# Patient Record
Sex: Female | Born: 1950 | Race: Black or African American | Hispanic: No | Smoking: Never smoker
Health system: Southern US, Community
[De-identification: ages and names within clinical notes are randomized; demographics above are authoritative.]

## PROBLEM LIST (undated history)

## (undated) DIAGNOSIS — M1990 Unspecified osteoarthritis, unspecified site: Secondary | ICD-10-CM

## (undated) DIAGNOSIS — I639 Cerebral infarction, unspecified: Secondary | ICD-10-CM

## (undated) DIAGNOSIS — E119 Type 2 diabetes mellitus without complications: Secondary | ICD-10-CM

---

## 2017-07-01 ENCOUNTER — Other Ambulatory Visit: Payer: Self-pay | Admitting: Gerontology

## 2017-07-01 DIAGNOSIS — R079 Chest pain, unspecified: Secondary | ICD-10-CM

## 2017-07-01 DIAGNOSIS — Z8249 Family history of ischemic heart disease and other diseases of the circulatory system: Secondary | ICD-10-CM

## 2017-07-02 ENCOUNTER — Telehealth (HOSPITAL_COMMUNITY): Payer: Self-pay | Admitting: Gerontology

## 2017-07-03 ENCOUNTER — Other Ambulatory Visit: Payer: Self-pay | Admitting: Nurse Practitioner

## 2017-07-03 ENCOUNTER — Ambulatory Visit
Admission: RE | Admit: 2017-07-03 | Discharge: 2017-07-03 | Disposition: A | Discharge status: Home / Self Care | Payer: Medicare (Managed Care) | Source: Ambulatory Visit | Attending: Nurse Practitioner | Admitting: Nurse Practitioner

## 2017-07-03 DIAGNOSIS — R079 Chest pain, unspecified: Secondary | ICD-10-CM

## 2017-07-03 DIAGNOSIS — R059 Cough, unspecified: Secondary | ICD-10-CM

## 2017-07-03 DIAGNOSIS — R05 Cough: Secondary | ICD-10-CM

## 2017-07-03 NOTE — Telephone Encounter (Signed)
User: Cherie Dark A Date/time: 07/02/17 9:28 AM  Comment: Called the office and lmsg for Kennyth Lose to [employer] for the last office note and possible EKG.  Context:  Outcome: Left Message  Phone number: [phone] Phone Type:   Comm. type: Telephone Call type: Outgoing  Contact: Darrelyn Hillock Relation to patient: Provider

## 2017-07-07 ENCOUNTER — Ambulatory Visit (HOSPITAL_COMMUNITY): Payer: Self-pay

## 2017-07-08 ENCOUNTER — Ambulatory Visit (HOSPITAL_COMMUNITY): Payer: Self-pay

## 2017-07-13 ENCOUNTER — Ambulatory Visit (HOSPITAL_COMMUNITY): Payer: Self-pay

## 2017-07-20 ENCOUNTER — Ambulatory Visit (HOSPITAL_COMMUNITY): Payer: Self-pay

## 2017-07-21 ENCOUNTER — Ambulatory Visit (HOSPITAL_COMMUNITY): Payer: Self-pay

## 2017-07-22 ENCOUNTER — Telehealth (HOSPITAL_COMMUNITY): Payer: Self-pay | Admitting: *Deleted

## 2017-07-22 NOTE — Telephone Encounter (Signed)
Attempted to call patient regarding upcoming appointment, 07/27/17, no answer.  [name] [name]

## 2017-07-23 ENCOUNTER — Other Ambulatory Visit (HOSPITAL_COMMUNITY): Payer: Self-pay | Admitting: Gerontology

## 2017-07-23 DIAGNOSIS — Z8249 Family history of ischemic heart disease and other diseases of the circulatory system: Secondary | ICD-10-CM

## 2017-07-23 DIAGNOSIS — R079 Chest pain, unspecified: Secondary | ICD-10-CM

## 2017-07-27 ENCOUNTER — Ambulatory Visit (HOSPITAL_COMMUNITY): Payer: Self-pay

## 2017-07-28 ENCOUNTER — Other Ambulatory Visit: Payer: Self-pay | Admitting: Family Medicine

## 2017-07-28 ENCOUNTER — Ambulatory Visit (HOSPITAL_COMMUNITY): Payer: Medicare (Managed Care)

## 2017-07-28 ENCOUNTER — Ambulatory Visit (HOSPITAL_COMMUNITY): Admission: RE | Admit: 2017-07-28 | Payer: Medicare (Managed Care) | Source: Ambulatory Visit

## 2017-07-28 DIAGNOSIS — Z1231 Encounter for screening mammogram for malignant neoplasm of breast: Secondary | ICD-10-CM

## 2017-07-30 ENCOUNTER — Encounter (HOSPITAL_COMMUNITY): Payer: Self-pay | Admitting: Radiology

## 2017-07-30 ENCOUNTER — Encounter (HOSPITAL_COMMUNITY)
Admission: RE | Admit: 2017-07-30 | Discharge: 2017-07-30 | Disposition: A | Discharge status: Home / Self Care | Payer: Medicare (Managed Care) | Source: Ambulatory Visit | Attending: Gerontology | Admitting: Gerontology

## 2017-07-30 DIAGNOSIS — R079 Chest pain, unspecified: Secondary | ICD-10-CM

## 2017-07-30 DIAGNOSIS — Z8249 Family history of ischemic heart disease and other diseases of the circulatory system: Secondary | ICD-10-CM

## 2017-07-30 MED ORDER — REGADENOSON 0.4 MG/5ML IV SOLN: 0.4000 mg | Freq: Once | INTRAVENOUS | Status: DC

## 2017-07-30 MED ORDER — TECHNETIUM TC 99M TETROFOSMIN IV KIT
30.0000 | PACK | Freq: Once | INTRAVENOUS | Status: AC | PRN
  Administered 2017-07-30: 30 via INTRAVENOUS

## 2017-07-30 MED ORDER — REGADENOSON 0.4 MG/5ML IV SOLN
INTRAVENOUS | Status: AC
  Filled 2017-07-30: qty 5

## 2017-07-31 ENCOUNTER — Encounter (HOSPITAL_COMMUNITY)
Admission: RE | Admit: 2017-07-31 | Discharge: 2017-07-31 | Disposition: A | Discharge status: Home / Self Care | Payer: Medicare (Managed Care) | Source: Ambulatory Visit | Attending: Gerontology | Admitting: Gerontology

## 2017-07-31 LAB — NM MYOCAR MULTI W/SPECT W/WALL MOTION / EF
LV dias vol: 80 mL (ref 46–106)
LV sys vol: 30 mL
Peak HR: 93 {beats}/min
Rest HR: 65 {beats}/min
TID: 1.08

## 2017-07-31 MED ORDER — TECHNETIUM TC 99M TETROFOSMIN IV KIT: 30.0000 | PACK | Freq: Once | INTRAVENOUS | Status: DC | PRN

## 2017-07-31 MED ORDER — TECHNETIUM TC 99M TETROFOSMIN IV KIT
30.0000 | PACK | Freq: Once | INTRAVENOUS | Status: AC | PRN
  Administered 2017-07-31: 30 via INTRAVENOUS

## 2017-08-17 ENCOUNTER — Ambulatory Visit
Admission: RE | Admit: 2017-08-17 | Discharge: 2017-08-17 | Disposition: A | Discharge status: Home / Self Care | Payer: Medicare (Managed Care) | Source: Ambulatory Visit | Attending: Family Medicine | Admitting: Family Medicine

## 2017-08-17 DIAGNOSIS — Z1231 Encounter for screening mammogram for malignant neoplasm of breast: Secondary | ICD-10-CM

## 2017-10-18 ENCOUNTER — Inpatient Hospital Stay (HOSPITAL_COMMUNITY)
Admission: EM | Admit: 2017-10-18 | Discharge: 2017-10-19 | DRG: 638 | Disposition: A | Discharge status: Home / Self Care | Payer: Medicare (Managed Care) | Attending: Internal Medicine | Admitting: Internal Medicine

## 2017-10-18 ENCOUNTER — Encounter (HOSPITAL_COMMUNITY): Payer: Self-pay

## 2017-10-18 ENCOUNTER — Other Ambulatory Visit: Payer: Self-pay

## 2017-10-18 DIAGNOSIS — Z8673 Personal history of transient ischemic attack (TIA), and cerebral infarction without residual deficits: Secondary | ICD-10-CM

## 2017-10-18 DIAGNOSIS — Z79899 Other long term (current) drug therapy: Secondary | ICD-10-CM | POA: Diagnosis not present

## 2017-10-18 DIAGNOSIS — E876 Hypokalemia: Secondary | ICD-10-CM | POA: Diagnosis present

## 2017-10-18 DIAGNOSIS — E1165 Type 2 diabetes mellitus with hyperglycemia: Secondary | ICD-10-CM | POA: Diagnosis present

## 2017-10-18 DIAGNOSIS — Z881 Allergy status to other antibiotic agents status: Secondary | ICD-10-CM | POA: Diagnosis not present

## 2017-10-18 DIAGNOSIS — N179 Acute kidney failure, unspecified: Secondary | ICD-10-CM | POA: Diagnosis present

## 2017-10-18 DIAGNOSIS — M1990 Unspecified osteoarthritis, unspecified site: Secondary | ICD-10-CM | POA: Diagnosis present

## 2017-10-18 DIAGNOSIS — Z993 Dependence on wheelchair: Secondary | ICD-10-CM | POA: Diagnosis not present

## 2017-10-18 DIAGNOSIS — Z8249 Family history of ischemic heart disease and other diseases of the circulatory system: Secondary | ICD-10-CM | POA: Diagnosis not present

## 2017-10-18 DIAGNOSIS — I10 Essential (primary) hypertension: Secondary | ICD-10-CM | POA: Diagnosis present

## 2017-10-18 DIAGNOSIS — R739 Hyperglycemia, unspecified: Secondary | ICD-10-CM

## 2017-10-18 DIAGNOSIS — IMO0002 Reserved for concepts with insufficient information to code with codable children: Secondary | ICD-10-CM | POA: Diagnosis present

## 2017-10-18 DIAGNOSIS — Z794 Long term (current) use of insulin: Secondary | ICD-10-CM

## 2017-10-18 DIAGNOSIS — Z833 Family history of diabetes mellitus: Secondary | ICD-10-CM

## 2017-10-18 DIAGNOSIS — Z7984 Long term (current) use of oral hypoglycemic drugs: Secondary | ICD-10-CM | POA: Diagnosis not present

## 2017-10-18 DIAGNOSIS — Z7982 Long term (current) use of aspirin: Secondary | ICD-10-CM

## 2017-10-18 DIAGNOSIS — E1110 Type 2 diabetes mellitus with ketoacidosis without coma: Principal | ICD-10-CM | POA: Diagnosis present

## 2017-10-18 DIAGNOSIS — I69349 Monoplegia of lower limb following cerebral infarction affecting unspecified side: Secondary | ICD-10-CM

## 2017-10-18 DIAGNOSIS — E860 Dehydration: Secondary | ICD-10-CM | POA: Diagnosis present

## 2017-10-18 DIAGNOSIS — I69398 Other sequelae of cerebral infarction: Secondary | ICD-10-CM | POA: Diagnosis not present

## 2017-10-18 HISTORY — DX: Type 2 diabetes mellitus without complications: E11.9

## 2017-10-18 HISTORY — DX: Cerebral infarction, unspecified: I63.9

## 2017-10-18 HISTORY — DX: Unspecified osteoarthritis, unspecified site: M19.90

## 2017-10-18 LAB — URINALYSIS, ROUTINE W REFLEX MICROSCOPIC
Bilirubin Urine: NEGATIVE
Hgb urine dipstick: NEGATIVE
KETONES UR: 20 mg/dL — AB
Leukocytes, UA: NEGATIVE
Nitrite: NEGATIVE
PROTEIN: NEGATIVE mg/dL
Specific Gravity, Urine: 1.024 (ref 1.005–1.030)
pH: 5 (ref 5.0–8.0)

## 2017-10-18 LAB — CBC
HCT: 39.7 % (ref 36.0–46.0)
HEMOGLOBIN: 12.6 g/dL (ref 12.0–15.0)
MCH: 27.6 pg (ref 26.0–34.0)
MCHC: 31.7 g/dL (ref 30.0–36.0)
MCV: 87.1 fL (ref 78.0–100.0)
PLATELETS: 229 10*3/uL (ref 150–400)
RBC: 4.56 MIL/uL (ref 3.87–5.11)
RDW: 13.7 % (ref 11.5–15.5)
WBC: 7.2 10*3/uL (ref 4.0–10.5)

## 2017-10-18 LAB — CBG MONITORING, ED
GLUCOSE-CAPILLARY: 534 mg/dL — AB (ref 65–99)
GLUCOSE-CAPILLARY: 391 mg/dL — AB (ref 65–99)
GLUCOSE-CAPILLARY: 387 mg/dL — AB (ref 65–99)
GLUCOSE-CAPILLARY: 345 mg/dL — AB (ref 65–99)
Glucose-Capillary: >600 mg/dL (ref 65–99)
Glucose-Capillary: 270 mg/dL — ABNORMAL HIGH (ref 65–99)

## 2017-10-18 LAB — BASIC METABOLIC PANEL
Anion gap: 17 — ABNORMAL HIGH (ref 5–15)
Anion gap: 17 — ABNORMAL HIGH (ref 5–15)
Anion gap: 15 (ref 5–15)
BUN: 33 mg/dL — ABNORMAL HIGH (ref 6–20)
BUN: 31 mg/dL — ABNORMAL HIGH (ref 6–20)
BUN: 27 mg/dL — ABNORMAL HIGH (ref 6–20)
CALCIUM: 9.8 mg/dL (ref 8.9–10.3)
CHLORIDE: 91 mmol/L — AB (ref 101–111)
CO2: 22 mmol/L (ref 22–32)
CO2: 20 mmol/L — ABNORMAL LOW (ref 22–32)
CO2: 22 mmol/L (ref 22–32)
CREATININE: 1.96 mg/dL — AB (ref 0.44–1.00)
Calcium: 9.6 mg/dL (ref 8.9–10.3)
Calcium: 9.4 mg/dL (ref 8.9–10.3)
Chloride: 97 mmol/L — ABNORMAL LOW (ref 101–111)
Chloride: 100 mmol/L — ABNORMAL LOW (ref 101–111)
Creatinine, Ser: 1.83 mg/dL — ABNORMAL HIGH (ref 0.44–1.00)
Creatinine, Ser: 1.71 mg/dL — ABNORMAL HIGH (ref 0.44–1.00)
GFR calc Af Amer: 29 mL/min — ABNORMAL LOW (ref >60)
GFR calc Af Amer: 32 mL/min — ABNORMAL LOW (ref >60)
GFR calc Af Amer: 35 mL/min — ABNORMAL LOW (ref >60)
GFR calc non Af Amer: 25 mL/min — ABNORMAL LOW (ref >60)
GFR calc non Af Amer: 28 mL/min — ABNORMAL LOW (ref >60)
GFR calc non Af Amer: 30 mL/min — ABNORMAL LOW (ref >60)
Glucose, Bld: 676 mg/dL (ref 65–99)
Glucose, Bld: 558 mg/dL (ref 65–99)
Glucose, Bld: 443 mg/dL — ABNORMAL HIGH (ref 65–99)
Potassium: 4.1 mmol/L (ref 3.5–5.1)
Potassium: 4 mmol/L (ref 3.5–5.1)
Potassium: 3.6 mmol/L (ref 3.5–5.1)
Sodium: 130 mmol/L — ABNORMAL LOW (ref 135–145)
Sodium: 134 mmol/L — ABNORMAL LOW (ref 135–145)
Sodium: 137 mmol/L (ref 135–145)

## 2017-10-18 LAB — I-STAT VENOUS BLOOD GAS, ED
Acid-base deficit: 5 mmol/L — ABNORMAL HIGH (ref 0.0–2.0)
Bicarbonate: 21.8 mmol/L (ref 20.0–28.0)
O2 Saturation: 65 %
PCO2 VEN: 46.8 mmHg (ref 44.0–60.0)
PH VEN: 7.276 (ref 7.250–7.430)
TCO2: 23 mmol/L (ref 22–32)
pO2, Ven: 38 mmHg (ref 32.0–45.0)

## 2017-10-18 LAB — HEMOGLOBIN A1C
Hgb A1c MFr Bld: 13.3 % — ABNORMAL HIGH (ref 4.8–5.6)
Mean Plasma Glucose: 335.01 mg/dL

## 2017-10-18 LAB — MAGNESIUM: Magnesium: 2.4 mg/dL (ref 1.7–2.4)

## 2017-10-18 MED ORDER — SODIUM CHLORIDE 0.45 % IV SOLN: INTRAVENOUS | Status: DC

## 2017-10-18 MED ORDER — SODIUM CHLORIDE 0.9 % IV SOLN
INTRAVENOUS | Status: DC
  Administered 2017-10-18: 150 mL/h via INTRAVENOUS

## 2017-10-18 MED ORDER — ROPINIROLE HCL 0.25 MG PO TABS
0.2500 mg | ORAL_TABLET | Freq: Every day | ORAL | Status: DC
  Administered 2017-10-18: 0.25 mg via ORAL
  Filled 2017-10-18 (×2): qty 1

## 2017-10-18 MED ORDER — SODIUM CHLORIDE 0.9 % IV BOLUS
1000.0000 mL | Freq: Once | INTRAVENOUS | Status: AC
  Administered 2017-10-18: 1000 mL via INTRAVENOUS

## 2017-10-18 MED ORDER — ENOXAPARIN SODIUM 40 MG/0.4ML ~~LOC~~ SOLN
40.0000 mg | SUBCUTANEOUS | Status: DC
  Administered 2017-10-18: 40 mg via SUBCUTANEOUS
  Filled 2017-10-18: qty 0.4

## 2017-10-18 MED ORDER — METOPROLOL TARTRATE 25 MG PO TABS
25.0000 mg | ORAL_TABLET | Freq: Two times a day (BID) | ORAL | Status: DC
  Administered 2017-10-18 – 2017-10-19 (×2): 25 mg via ORAL
  Filled 2017-10-18 (×2): qty 1

## 2017-10-18 MED ORDER — GABAPENTIN 300 MG PO CAPS
300.0000 mg | ORAL_CAPSULE | Freq: Every day | ORAL | Status: DC
  Administered 2017-10-18: 300 mg via ORAL
  Filled 2017-10-18: qty 1

## 2017-10-18 MED ORDER — ATORVASTATIN CALCIUM 20 MG PO TABS
20.0000 mg | ORAL_TABLET | Freq: Every day | ORAL | Status: DC
  Administered 2017-10-18: 20 mg via ORAL
  Filled 2017-10-18: qty 1

## 2017-10-18 MED ORDER — AMLODIPINE BESYLATE 5 MG PO TABS
10.0000 mg | ORAL_TABLET | Freq: Every day | ORAL | Status: DC
  Administered 2017-10-18 – 2017-10-19 (×2): 10 mg via ORAL
  Filled 2017-10-18 (×2): qty 1

## 2017-10-18 MED ORDER — POTASSIUM CHLORIDE 10 MEQ/100ML IV SOLN
10.0000 meq | INTRAVENOUS | Status: AC
  Administered 2017-10-18 (×3): 10 meq via INTRAVENOUS
  Filled 2017-10-18 (×3): qty 100

## 2017-10-18 MED ORDER — INSULIN ASPART 100 UNIT/ML ~~LOC~~ SOLN
10.0000 [IU] | Freq: Once | SUBCUTANEOUS | Status: AC
  Administered 2017-10-18: 10 [IU] via INTRAVENOUS
  Filled 2017-10-18: qty 1

## 2017-10-18 MED ORDER — POLYETHYLENE GLYCOL 3350 17 G PO PACK: 17.0000 g | PACK | Freq: Every day | ORAL | Status: DC | PRN

## 2017-10-18 MED ORDER — DEXTROSE-NACL 5-0.45 % IV SOLN
INTRAVENOUS | Status: DC
  Administered 2017-10-19: via INTRAVENOUS

## 2017-10-18 MED ORDER — SODIUM CHLORIDE 0.9 % IV SOLN
INTRAVENOUS | Status: DC
  Administered 2017-10-18: 3.3 [IU]/h via INTRAVENOUS
  Filled 2017-10-18: qty 1

## 2017-10-18 MED ORDER — SODIUM CHLORIDE 0.9 % IV SOLN: INTRAVENOUS | Status: DC

## 2017-10-18 MED ORDER — ASPIRIN-DIPYRIDAMOLE ER 25-200 MG PO CP12
1.0000 | ORAL_CAPSULE | Freq: Two times a day (BID) | ORAL | Status: DC
  Administered 2017-10-18 – 2017-10-19 (×2): 1 via ORAL
  Filled 2017-10-18 (×3): qty 1

## 2017-10-18 NOTE — ED Notes (Signed)
Lab at [facility]sdie 

## 2017-10-18 NOTE — H&P (Signed)
Date: 10/18/2017               Patient Name:  [name] [name] MRN: [id number]  DOB: October 11, 1950 Age / Sex: 67 y.o., female   PCP: Janifer Adie, MD         Medical Service: Internal Medicine Teaching Service         Attending Physician: Dr. Oda Kilts, MD    First Contact: Dr. Berline Lopes Pager: [phone]  Second Contact: Dr. Hetty Ely Pager: [phone]       After Hours (After 5p/  First Contact Pager: [phone]  weekends / holidays): Second Contact Pager: [phone]   Chief Complaint: High blood sugar at home  History of Present Illness: [name] [name] is a 67 yo F with a PMHx notable for HTN, insulin independent diabetes, prior CVA x 3 now wheelchair dependent, and arthritis who presented to the ED for one month of increased thirst, and 2 weeks of decreased appetite, dry mouth with a measured blood glucose near 600 today. She stated that she was formerly on insulin but was discontinued off of insulin due to improvement of her diabetes. She now takes her oral medications regularly and was previously well controlled on those until she started to feel bad last month. She obtained a glucometer one week ago, but did not check her CBG until today. She denied increased urination, dizziness, confusion, headache, constipation, fever, chills, myalgias, chest pain, abdominal pain, mediation noncompliance, or joint pain. She attested to diarrhea, blurry vision, and generalized fatigue. She last took her medications the evening prior to presentation.   ED Course: BMP revealed a glucose of 676, pseudohyponatremia of 130, potassium 4.1, CO2 22, anion gap of 17, CBC that was unremarkable, HgbAlc 13.3, VBG with pH 7.276 and acid/base deficit of 5.0, and a UA indicating >500glucose and positive for ketones. She was admitted for hyperglycemia due to recently uncontrolled diabetes.   Meds:  Current Meds  Medication Sig  . acetaminophen (TYLENOL) 500 MG tablet Take 500 mg by mouth at bedtime.  Marland Kitchen  amLODipine (NORVASC) 10 MG tablet Take 10 mg by mouth daily. hypertension  . Artificial Saliva (BIOTENE DRY MOUTH MOISTURIZING) SOLN Use as directed 1 spray in the mouth or throat 6 (six) times daily.  Marland Kitchen atorvastatin (LIPITOR) 20 MG tablet Take 20 mg by mouth daily.  Marland Kitchen buPROPion (WELLBUTRIN XL) 300 MG 24 hr tablet Take 300 mg by mouth daily. depression  . cloNIDine (CATAPRES - DOSED IN MG/24 HR) 0.1 mg/24hr patch Place 0.1 mg onto the skin every Sunday. hypertension  . dipyridamole-aspirin (AGGRENOX) 200-25 MG 12hr capsule Take 1 capsule by mouth 2 (two) times daily. Stroke prevention  . gabapentin (NEURONTIN) 300 MG capsule Take 300 mg by mouth at bedtime.  . hydrochlorothiazide (HYDRODIURIL) 25 MG tablet Take 25 mg by mouth daily. CHF  . metFORMIN (GLUCOPHAGE) 500 MG tablet Take 500 mg by mouth every other day.  . metoprolol tartrate (LOPRESSOR) 25 MG tablet Take 25 mg by mouth 2 (two) times daily. hypertension  . nitroGLYCERIN (NITROSTAT) 0.4 MG SL tablet Place 0.4 mg under the tongue every 5 (five) minutes as needed for chest pain.  . polyethylene glycol (MIRALAX / GLYCOLAX) packet Take 17 g by mouth daily as needed (constipation). Mix in 8 oz liquid and drink  . potassium chloride (K-DUR,KLOR-CON) 10 MEQ tablet Take 10 mEq by mouth daily.  Marland Kitchen rOPINIRole (REQUIP) 0.25 MG tablet Take 0.25 mg by mouth at bedtime.  . simethicone (MYLICON) 536 MG  chewable tablet Chew 125 mg by mouth 3 (three) times daily as needed for flatulence (before meals as needed).  . sitaGLIPtin (JANUVIA) 50 MG tablet Take 50 mg by mouth daily.  . Zinc Oxide (BALMEX EX) Apply 1 application topically 2 (two) times daily as needed (incontinence).   Allergies: Allergies as of 10/18/2017 - Review Complete 10/18/2017  Allergen Reaction Noted  . Ciprofloxacin Hives 10/18/2017   Past Medical History:  Diagnosis Date  . Arthritis   . Diabetes mellitus without complication ([place])   . Stroke [facility])    Family History: Mother  HTN, DMII Sister HTN, DMII  Social History:  Denied EtOH, tobacco, drugs  Review of Systems: A complete ROS was negative except as per HPI.   Physical Exam: Blood pressure 128/74, pulse 76, temperature 98.2 F (36.8 C), temperature source Oral, resp. rate 16, SpO2 100 %. Physical Exam  Constitutional: She is oriented to person, place, and time. She appears well-developed and well-nourished. No distress.  HENT:  Head: Normocephalic and atraumatic.  Eyes: Conjunctivae and EOM are normal.  Neck: Normal range of motion. Neck supple. No JVD present.  Cardiovascular: Normal rate and regular rhythm.  No murmur heard. Pulmonary/Chest: Effort normal and breath sounds normal. No stridor. No respiratory distress.  Abdominal: Soft. Bowel sounds are normal. She exhibits no distension. There is no tenderness.  Musculoskeletal: She exhibits no edema or tenderness.  Neurological: She is oriented to person, place, and time. She has normal strength. No cranial nerve deficit.  Skin: Skin is warm. Capillary refill takes less than 2 seconds. She is not diaphoretic.  Psychiatric: She has a normal mood and affect. Her speech is slurred.  Vitals reviewed.  EKG: personally reviewed my interpretation is n/a  CXR: personally reviewed my interpretation is n/a  Assessment & Plan by Problem: Active Problems:   Diabetic ketoacidosis (HCC)   Hypertension   History of stroke  Assessment:  This is a 67 yo F with a PMHx most notable for DMII, HTN, prior CVA with residual mobility deficits and lower extremity weakness, who presented for hyperglycemia and increased thirst. Her presentation is concerning for DKA vs HHS given the the hyperglycemia, mild ketonuria, anion gap, increased thirst, dehydration and sluggish verbal responses. Absent focal deficits on neuro exam I am less concerned for CVA but given her history we will monitor her closely in stepdown.   Plan: DKA vs HHS: CBG >600, urinary ketones, anion  gap minus metabolic acidosis on BMP is somewhat concerning for DKA vs HHS. More so for HHS given the lack of acidosis. The patient demonstrated sluggish verbal responses which I suspect are related to her metabolic derangement. Given the lack of focal deficits on physical exam I do not feel this is a TIA or CVA. Also, with the length of time with symptoms she is outside the window for TPA. We will continue risk factor modification and treat her underlying metabolic condition.  --Initiated an insulin drip with close monitoring of her potassium and anion gap with Q4 hour BMP's --Normal saline at 142ml/hr until her CBG<250, then CBG 250mg /dl then D5W-half NS at 129ml/hr until gap closes x 2 --Potasium 4.1, ordered four runs of K+, recheck BMP in four hours --HgbA1c 13.3 --Replete potassium as indicated  --Frequent neuro checks Q 4 hours  Acute renal injury: Most likely due to prerenal 2/2 volume depletion w/ a BUN/Cr of ~17, continue IV fluids as per DKA/HHS protocol.  --Repeat BMP q4 hours  HTN: BP 128/74. Will continue to  monitor while holding clonidine.  --Resume amlodipine 10mg  daily --Lopressor 25mg  BID  Hx of CVA: No acute focal deficits but baseline unclear. --Continue atorvastatin and dipyridamole-ASA  Diet: Carb modified Code: Prior Fluids: NS at 122ml/hr until CBG 250mg /dl then D5W-half NS at 131ml/hr until gap closes x 2  VTE ppx: enoxaparin  Dispo: Admit patient to Inpatient with expected length of stay greater than 2 midnights.  Signed: Kathi Ludwig, MD 10/18/2017, 6:53 PM  Pager: Pager# [phone]

## 2017-10-18 NOTE — ED Provider Notes (Signed)
[place] EMERGENCY DEPARTMENT Provider Note   CSN: [id number] Arrival date & time: 10/18/17  1215     History   Chief Complaint Chief Complaint  Patient presents with  . Hyperglycemia    HPI [name] [name] is a 67 y.o. female.  HPI   [name] [name] is a 67yo female with a history of type 2 diabetes, arthritis and CVA (wheelchair-bound) who presents to the emergency department via EMS for evaluation of hyperglycemia.  Patient reports that she has felt thirsty and had a dry mouth for the past 4 weeks now.  Also states that she feels more tired.  States that she checked her blood sugar at home today and reading was >600.  Her daughter whom she lives with subsequently called 911.  Patient reports that she normally takes metformin for her diabetes, denies any other medications or insulin use.  Denies any other symptoms.  Denies polyuria, polyphagia, fever, chills, headache, numbness, weakness, chest pain, shortness of breath, abdominal pain, nausea/vomiting, diarrhea, dysuria, lightheadedness or syncope.  Past Medical History:  Diagnosis Date  . Arthritis   . Diabetes mellitus without complication ([place])   . Stroke [facility])     There are no active problems to display for this patient.   No past surgical history on file.   OB History   None      Home Medications    Prior to Admission medications   Not on File    Family History No family history on file.  Social History Social History   Tobacco Use  . Smoking status: Not on file  Substance Use Topics  . Alcohol use: Not on file  . Drug use: Not on file     Allergies   Patient has no allergy information on record.   Review of Systems Review of Systems  Constitutional: Negative for chills and fever.  HENT: Negative for sore throat.   Eyes: Negative for visual disturbance.  Respiratory: Negative for shortness of breath.   Cardiovascular: Negative for chest pain.  Gastrointestinal: Negative  for abdominal pain, diarrhea, nausea and vomiting.  Endocrine: Positive for polydipsia. Negative for polyphagia and polyuria.  Genitourinary: Negative for difficulty urinating, dysuria and frequency.  Musculoskeletal: Positive for gait problem (chronically wheel chair bound). Negative for back pain.  Skin: Negative for rash.  Neurological: Negative for speech difficulty, weakness, light-headedness, numbness and headaches.  Psychiatric/Behavioral: Negative for agitation.     Physical Exam Updated Vital Signs BP (!) 142/74   Pulse 77   Temp 98.2 F (36.8 C) (Oral)   Resp 15   SpO2 100%   Physical Exam  Constitutional: She is oriented to person, place, and time. She appears well-developed and well-nourished. No distress.  Sitting at bedside in no apparent distress, nontoxic-appearing.  HENT:  Head: Normocephalic and atraumatic.  Mucous membranes dry.  Eyes: Pupils are equal, round, and reactive to light. Conjunctivae are normal. Right eye exhibits no discharge. Left eye exhibits no discharge.  Neck: Normal range of motion. Neck supple.  Cardiovascular: Normal rate, regular rhythm and intact distal pulses. Exam reveals no friction rub.  No murmur heard. Pulmonary/Chest: Effort normal and breath sounds normal. No stridor. No respiratory distress. She has no wheezes. She has no rales.  Abdominal: Soft. Bowel sounds are normal. There is no tenderness. There is no guarding.  Neurological: She is alert and oriented to person, place, and time. Coordination normal.  Mental Status:  Alert, oriented x 3, thought content appropriate, able to give  a coherent history. Speech fluent without evidence of aphasia. Able to follow 2 step commands without difficulty.  Motor:  Normal tone. 5/5 in upper and lower extremities bilaterally including strong and equal grip strength and dorsiflexion/plantar flexion Sensory: Pinprick and light touch normal in all extremities.   Skin: Skin is warm and dry.  Capillary refill takes less than 2 seconds. She is not diaphoretic.  Psychiatric: She has a normal mood and affect. Her behavior is normal.  Nursing note and vitals reviewed.    ED Treatments / Results  Labs (all labs ordered are listed, but only abnormal results are displayed) Labs Reviewed  BASIC METABOLIC PANEL - Abnormal; Notable for the following components:      Result Value   Sodium 130 (*)    Chloride 91 (*)    Glucose, Bld 676 (*)    BUN 33 (*)    Creatinine, Ser 1.96 (*)    GFR calc non Af Amer 25 (*)    GFR calc Af Amer 29 (*)    Anion gap 17 (*)    All other components within normal limits  CBG MONITORING, ED - Abnormal; Notable for the following components:   Glucose-Capillary >600 (*)    All other components within normal limits  CBC  URINALYSIS, ROUTINE W REFLEX MICROSCOPIC  BLOOD GAS, VENOUS    EKG None  Radiology No results found.  Procedures Procedures (including critical care time)  Medications Ordered in ED Medications  insulin aspart (novoLOG) injection 10 Units (has no administration in time range)  sodium chloride 0.9 % bolus 1,000 mL (1,000 mLs Intravenous New Bag/Given 10/18/17 1302)  sodium chloride 0.9 % bolus 1,000 mL (1,000 mLs Intravenous New Bag/Given 10/18/17 1302)     Initial Impression / Assessment and Plan / ED Course  I have reviewed the triage vital signs and the nursing notes.  Pertinent labs & imaging results that were available during my care of the patient were reviewed by me and considered in my medical decision making (see chart for details).     Patient who is a known diabetic presents to the ED for hyperglycemia. On initial presentation patient blood glucose 676.  Anion gap 17, patient's bicarb normal. Venous blood gas pending. She also has an elevated creatinine of 1.96, no baseline to compare to.  Patient started on 2 L fluid bolus and subcutaneous insulin. CBC reviewed, WNL.   Discussed this patient with Dr. Zenia Resides  who agrees with plan to admit patient for hyperglycemia and presumed AKI. Discussed this patient with Internal Medicine Dr. Berline Lopes who will admit the patient. Patient informed.   Final Clinical Impressions(s) / ED Diagnoses   Final diagnoses:  None    ED Discharge Orders    None       Glyn Ade, PA-C 10/19/17 [id number]

## 2017-10-18 NOTE — ED Provider Notes (Signed)
Medical screening examination/treatment/procedure(s) were conducted as a shared visit with non-physician practitioner(s) and myself.  I personally evaluated the patient during the encounter.  None 68 year old female here with complaint of hyperglycemia at home.  Blood sugar here is 666.  Also have evidence of acute kidney injury.  Treated with IV fluids and insulin.  Will admit to family medicine service   Lacretia Leigh, MD 10/18/17 [phone]

## 2017-10-18 NOTE — ED Triage Notes (Signed)
Pt brought in by [facility] from home for complaints of extreme thirst and hyperglycemia. Per EMS pt not able to check CBG x2 weeks- was finally able to check x4 days ago, meter reading high. Pt has hx of multiple TIAs and arthritis resulting in pt being wheelchair-bound at home. Pt in NAD on arrival to ED.

## 2017-10-18 NOTE — ED Notes (Signed)
IV attempted x1 without success

## 2017-10-19 DIAGNOSIS — I69398 Other sequelae of cerebral infarction: Secondary | ICD-10-CM

## 2017-10-19 DIAGNOSIS — Z7984 Long term (current) use of oral hypoglycemic drugs: Secondary | ICD-10-CM

## 2017-10-19 DIAGNOSIS — E1110 Type 2 diabetes mellitus with ketoacidosis without coma: Principal | ICD-10-CM | POA: Diagnosis present

## 2017-10-19 DIAGNOSIS — I10 Essential (primary) hypertension: Secondary | ICD-10-CM

## 2017-10-19 DIAGNOSIS — N179 Acute kidney failure, unspecified: Secondary | ICD-10-CM | POA: Diagnosis not present

## 2017-10-19 DIAGNOSIS — Z7982 Long term (current) use of aspirin: Secondary | ICD-10-CM

## 2017-10-19 DIAGNOSIS — Z79899 Other long term (current) drug therapy: Secondary | ICD-10-CM

## 2017-10-19 DIAGNOSIS — E876 Hypokalemia: Secondary | ICD-10-CM

## 2017-10-19 DIAGNOSIS — Z993 Dependence on wheelchair: Secondary | ICD-10-CM

## 2017-10-19 DIAGNOSIS — Z881 Allergy status to other antibiotic agents status: Secondary | ICD-10-CM

## 2017-10-19 DIAGNOSIS — Z833 Family history of diabetes mellitus: Secondary | ICD-10-CM

## 2017-10-19 DIAGNOSIS — Z8249 Family history of ischemic heart disease and other diseases of the circulatory system: Secondary | ICD-10-CM

## 2017-10-19 DIAGNOSIS — M1990 Unspecified osteoarthritis, unspecified site: Secondary | ICD-10-CM

## 2017-10-19 LAB — BASIC METABOLIC PANEL
Anion gap: 8 (ref 5–15)
Anion gap: 7 (ref 5–15)
BUN: 20 mg/dL (ref 6–20)
BUN: 20 mg/dL (ref 6–20)
CO2: 23 mmol/L (ref 22–32)
CO2: 22 mmol/L (ref 22–32)
Calcium: 8.8 mg/dL — ABNORMAL LOW (ref 8.9–10.3)
Calcium: 8.9 mg/dL (ref 8.9–10.3)
Chloride: 107 mmol/L (ref 101–111)
Chloride: 106 mmol/L (ref 101–111)
Creatinine, Ser: 1.17 mg/dL — ABNORMAL HIGH (ref 0.44–1.00)
Creatinine, Ser: 1.13 mg/dL — ABNORMAL HIGH (ref 0.44–1.00)
GFR calc Af Amer: 55 mL/min — ABNORMAL LOW (ref >60)
GFR calc Af Amer: 57 mL/min — ABNORMAL LOW (ref >60)
GFR calc non Af Amer: 47 mL/min — ABNORMAL LOW (ref >60)
GFR calc non Af Amer: 50 mL/min — ABNORMAL LOW (ref >60)
Glucose, Bld: 148 mg/dL — ABNORMAL HIGH (ref 65–99)
Glucose, Bld: 166 mg/dL — ABNORMAL HIGH (ref 65–99)
Potassium: 3.4 mmol/L — ABNORMAL LOW (ref 3.5–5.1)
Potassium: 3.5 mmol/L (ref 3.5–5.1)
Sodium: 138 mmol/L (ref 135–145)
Sodium: 135 mmol/L (ref 135–145)

## 2017-10-19 LAB — CBG MONITORING, ED
GLUCOSE-CAPILLARY: 182 mg/dL — AB (ref 65–99)
GLUCOSE-CAPILLARY: 139 mg/dL — AB (ref 65–99)
GLUCOSE-CAPILLARY: 179 mg/dL — AB (ref 65–99)
Glucose-Capillary: 133 mg/dL — ABNORMAL HIGH (ref 65–99)
Glucose-Capillary: 150 mg/dL — ABNORMAL HIGH (ref 65–99)
Glucose-Capillary: 153 mg/dL — ABNORMAL HIGH (ref 65–99)
Glucose-Capillary: 160 mg/dL — ABNORMAL HIGH (ref 65–99)
Glucose-Capillary: 214 mg/dL — ABNORMAL HIGH (ref 65–99)
Glucose-Capillary: 233 mg/dL — ABNORMAL HIGH (ref 65–99)

## 2017-10-19 MED ORDER — POTASSIUM CHLORIDE 10 MEQ/100ML IV SOLN
10.0000 meq | Freq: Once | INTRAVENOUS | Status: AC
  Administered 2017-10-19: 10 meq via INTRAVENOUS
  Filled 2017-10-19: qty 100

## 2017-10-19 MED ORDER — INSULIN ASPART 100 UNIT/ML ~~LOC~~ SOLN
3.0000 [IU] | Freq: Three times a day (TID) | SUBCUTANEOUS | Status: DC
  Administered 2017-10-19 (×2): 3 [IU] via SUBCUTANEOUS
  Filled 2017-10-19 (×2): qty 1

## 2017-10-19 MED ORDER — POTASSIUM CHLORIDE CRYS ER 20 MEQ PO TBCR
40.0000 meq | EXTENDED_RELEASE_TABLET | Freq: Once | ORAL | Status: AC
  Administered 2017-10-19: 40 meq via ORAL
  Filled 2017-10-19: qty 2

## 2017-10-19 MED ORDER — INSULIN GLARGINE 100 UNIT/ML ~~LOC~~ SOLN
10.0000 [IU] | Freq: Once | SUBCUTANEOUS | Status: AC
  Administered 2017-10-19: 10 [IU] via SUBCUTANEOUS
  Filled 2017-10-19: qty 0.1

## 2017-10-19 MED ORDER — ACETAMINOPHEN 500 MG PO TABS
1000.0000 mg | ORAL_TABLET | Freq: Once | ORAL | Status: AC
  Administered 2017-10-19: 1000 mg via ORAL

## 2017-10-19 MED ORDER — INSULIN ASPART 100 UNIT/ML FLEXPEN: 5.0000 [IU] | PEN_INJECTOR | Freq: Three times a day (TID) | SUBCUTANEOUS | 11 refills | Status: DC

## 2017-10-19 MED ORDER — LIDOCAINE 5 % EX PTCH
1.0000 | MEDICATED_PATCH | CUTANEOUS | Status: DC
  Administered 2017-10-19 (×2): 1 via TRANSDERMAL
  Filled 2017-10-19 (×2): qty 1

## 2017-10-19 MED ORDER — INSULIN ASPART 100 UNIT/ML ~~LOC~~ SOLN
0.0000 [IU] | Freq: Three times a day (TID) | SUBCUTANEOUS | Status: DC
  Administered 2017-10-19: 3 [IU] via SUBCUTANEOUS
  Administered 2017-10-19: 2 [IU] via SUBCUTANEOUS
  Filled 2017-10-19 (×2): qty 1

## 2017-10-19 MED ORDER — INSULIN ASPART 100 UNIT/ML ~~LOC~~ SOLN: 5.0000 [IU] | Freq: Three times a day (TID) | SUBCUTANEOUS | Status: DC

## 2017-10-19 MED ORDER — ACETAMINOPHEN 325 MG PO TABS: 650.0000 mg | ORAL_TABLET | Freq: Four times a day (QID) | ORAL | Status: DC | PRN

## 2017-10-19 MED ORDER — INSULIN GLARGINE 100 UNIT/ML ~~LOC~~ SOLN: 15.0000 [IU] | Freq: Every day | SUBCUTANEOUS | Status: DC

## 2017-10-19 MED ORDER — INSULIN GLARGINE 100 UNIT/ML SOLOSTAR PEN: 15.0000 [IU] | PEN_INJECTOR | Freq: Every day | SUBCUTANEOUS | 11 refills | Status: DC

## 2017-10-19 MED ORDER — ACETAMINOPHEN 500 MG PO TABS
1000.0000 mg | ORAL_TABLET | Freq: Four times a day (QID) | ORAL | Status: DC | PRN
  Filled 2017-10-19: qty 2

## 2017-10-19 NOTE — ED Notes (Signed)
Attempted report 

## 2017-10-19 NOTE — Discharge Instructions (Signed)
We have started you on insulin both a long acting kind to be taken one time per day at bed and a short acting time to be taken with each meal. Please be sure to follow-up with your primary care doctor to discuss adjustments to these medications as needed.   Thank you for your visit to [facility].  Hypoglycemia Hypoglycemia is when the sugar (glucose) level in the blood is too low. Symptoms of low blood sugar may include:  Feeling: ? Hungry. ? Worried or nervous (anxious). ? Sweaty and clammy. ? Confused. ? Dizzy. ? Sleepy. ? Sick to your stomach (nauseous).  Having: ? A fast heartbeat. ? A headache. ? A change in your vision. ? Jerky movements that you cannot control (seizure). ? Nightmares. ? Tingling or no feeling (numbness) around the mouth, lips, or tongue.  Having trouble with: ? Talking. ? Paying attention (concentrating). ? Moving (coordination). ? Sleeping.  Shaking.  Passing out (fainting).  Getting upset easily (irritability).  Low blood sugar can happen to people who have diabetes and people who do not have diabetes. Low blood sugar can happen quickly, and it can be an emergency. Treating Low Blood Sugar Low blood sugar is often treated by eating or drinking something sugary right away. If you can think clearly and swallow safely, follow the 15:15 rule:  Take 15 grams of a fast-acting carb (carbohydrate). Some fast-acting carbs are: ? 1 tube of glucose gel. ? 3 sugar tablets (glucose pills). ? 6-8 pieces of hard candy. ? 4 oz (120 mL) of fruit juice. ? 4 oz (120 mL) of regular (not diet) soda.  Check your blood sugar 15 minutes after you take the carb.  If your blood sugar is still at or below 70 mg/dL (3.9 mmol/L), take 15 grams of a carb again.  If your blood sugar does not go above 70 mg/dL (3.9 mmol/L) after 3 tries, get help right away.  After your blood sugar goes back to normal, eat a meal or a snack within 1 hour.  Treating Very Low  Blood Sugar If your blood sugar is at or below 54 mg/dL (3 mmol/L), you have very low blood sugar (severe hypoglycemia). This is an emergency. Do not wait to see if the symptoms will go away. Get medical help right away. Call your local emergency services (911 in the [place] Do not drive yourself to the hospital. If you have very low blood sugar and you cannot eat or drink, you may need a glucagon shot (injection). A family member or friend should learn how to check your blood sugar and how to give you a glucagon shot. Ask your doctor if you need to have a glucagon shot kit at home. Follow these instructions at home: General instructions  Avoid any diets that cause you to not eat enough food. Talk with your doctor before you start any new diet.  Take over-the-counter and prescription medicines only as told by your doctor.  Limit alcohol to no more than 1 drink per day for nonpregnant women and 2 drinks per day for men. One drink equals 12 oz of beer, 5 oz of wine, or 1 oz of hard liquor.  Keep all follow-up visits as told by your doctor. This is important. If You Have Diabetes:   Make sure you know the symptoms of low blood sugar.  Always keep a source of sugar with you, such as: ? Sugar. ? Sugar tablets. ? Glucose gel. ? Fruit juice. ? Regular  soda (not diet soda). ? Milk. ? Hard candy. ? Honey.  Take your medicines as told.  Follow your exercise and meal plan. ? Eat on time. Do not skip meals. ? Follow your sick day plan when you cannot eat or drink normally. Make this plan ahead of time with your doctor.  Check your blood sugar as often as told by your doctor. Always check before and after exercise.  Share your diabetes care plan with: ? Your work or school. ? People you live with.  Check your pee (urine) for ketones: ? When you are sick. ? As told by your doctor.  Carry a card or wear jewelry that says you have diabetes. If You Have Low Blood Sugar From Other  Causes:   Check your blood sugar as often as told by your doctor.  Follow instructions from your doctor about what you cannot eat or drink. Contact a doctor if:  You have trouble keeping your blood sugar in your target range.  You have low blood sugar often. Get help right away if:  You still have symptoms after you eat or drink something sugary.  Your blood sugar is at or below 54 mg/dL (3 mmol/L).  You have jerky movements that you cannot control.  You pass out. These symptoms may be an emergency. Do not wait to see if the symptoms will go away. Get medical help right away. Call your local emergency services (911 in the [place] Do not drive yourself to the hospital. This information is not intended to replace advice given to you by your health care provider. Make sure you discuss any questions you have with your health care provider. Document Released: 09/03/2009 Document Revised: 11/15/2015 Document Reviewed: 07/13/2015 Elsevier Interactive Patient Education  2018 [employer].  Hyperglycemia Hyperglycemia is when the sugar (glucose) level in your blood is too high. It may not cause symptoms. If you do have symptoms, they may include warning signs, such as:  Feeling more thirsty than normal.  Hunger.  Feeling tired.  Needing to pee (urinate) more than normal.  Blurry eyesight (vision).  You may get other symptoms as it gets worse, such as:  Dry mouth.  Not being hungry (loss of appetite).  Fruity-smelling breath.  Weakness.  Weight gain or loss that is not planned. Weight loss may be fast.  A tingling or numb feeling in your hands or feet.  Headache.  Skin that does not bounce back quickly when it is lightly pinched and released (poor skin turgor).  Pain in your belly (abdomen).  Cuts or bruises that heal slowly.  High blood sugar can happen to people who do or do not have diabetes. High blood sugar can happen slowly or quickly, and it can be an  emergency. Follow these instructions at home: General instructions  Take over-the-counter and prescription medicines only as told by your doctor.  Do not use products that contain nicotine or tobacco, such as cigarettes and e-cigarettes. If you need help quitting, ask your doctor.  Limit alcohol intake to no more than 1 drink per day for nonpregnant women and 2 drinks per day for men. One drink equals 12 oz of beer, 5 oz of wine, or 1 oz of hard liquor.  Manage stress. If you need help with this, ask your doctor.  Keep all follow-up visits as told by your doctor. This is important. Eating and drinking  Stay at a healthy weight.  Exercise regularly, as told by your doctor.  Drink enough  fluid, especially when you: ? Exercise. ? Get sick. ? Are in hot temperatures.  Eat healthy foods, such as: ? Low-fat (lean) proteins. ? Complex carbs (complex carbohydrates), such as whole wheat bread or brown rice. ? Fresh fruits and vegetables. ? Low-fat dairy products. ? Healthy fats.  Drink enough fluid to keep your pee (urine) clear or pale yellow. If you have diabetes:  Make sure you know the symptoms of hyperglycemia.  Follow your diabetes management plan, as told by your doctor. Make sure you: ? Take insulin and medicines as told. ? Follow your exercise plan. ? Follow your meal plan. Eat on time. Do not skip meals. ? Check your blood sugar as often as told. Make sure to check before and after exercise. If you exercise longer or in a different way than you normally do, check your blood sugar more often. ? Follow your sick day plan whenever you cannot eat or drink normally. Make this plan ahead of time with your doctor.  Share your diabetes management plan with people in your workplace, school, and household.  Check your urine for ketones when you are ill and as told by your doctor.  Carry a card or wear jewelry that says that you have diabetes. Contact a doctor if:  Your blood  sugar level is higher than 240 mg/dL (13.3 mmol/L) for 2 days in a row.  You have problems keeping your blood sugar in your target range.  High blood sugar happens often for you. Get help right away if:  You have trouble breathing.  You have a change in how you think, feel, or act (mental status).  You feel sick to your stomach (nauseous), and that feeling does not go away.  You cannot stop throwing up (vomiting). These symptoms may be an emergency. Do not wait to see if the symptoms will go away. Get medical help right away. Call your local emergency services (911 in the [place] Do not drive yourself to the hospital. Summary  Hyperglycemia is when the sugar (glucose) level in your blood is too high.  High blood sugar can happen to people who do or do not have diabetes.  Make sure you drink enough fluids, eat healthy foods, and exercise regularly.  Contact your doctor if you have problems keeping your blood sugar in your target range. This information is not intended to replace advice given to you by your health care provider. Make sure you discuss any questions you have with your health care provider. Document Released: 04/06/2009 Document Revised: 02/25/2016 Document Reviewed: 02/25/2016 Elsevier Interactive Patient Education  2017 [employer].

## 2017-10-19 NOTE — Progress Notes (Signed)
   Subjective: The patient was resting in her bed today. She stated that she feels well but continues to respond slowly to questions which as per her provider at [facility] is near her baseline. She is in agreement with being discharged home today on insulin.   Objective:  Vital signs in last 24 hours: Vitals:   10/19/17 0700 10/19/17 0800 10/19/17 0900 10/19/17 1030  BP: 128/62 132/65 135/66 114/69  Pulse: 68 89 76   Resp: 12 10 14 13   Temp:      TempSrc:      SpO2: (!) 89% 94% 92%   Weight:   250 lb (113.4 kg)   Height:   5\' 7"  (1.702 m)    Physical Exam: General: In no acute distress, alert and oriented, conversant, afebrile, nondiaphoretic Cardio: RRR, no murmurs rubs or gallops Pulm: Lungs clear to auscultation bilaterally  GI: abdomin soft, nontender, nondistended Musc: bilateral lower extremities nontender nondistended  Assessment/Plan:  Active Problems:   Diabetic ketoacidosis (HCC)   Hypertension   History of stroke  Assessment:  This is a 67 yo F with a PMHx most notable for DMII, HTN, prior CVA with residual mobility deficits and lower extremity weakness, who presented for hyperglycemia and increased thirst. Her presentation is concerning for DKA vs HHS given the the hyperglycemia, mild ketonuria, anion gap, increased thirst, dehydration and sluggish verbal responses. Absent focal deficits on neuro exam, I am less concerned for CVA but given her history she was monitored closely in stepdown. She improved rapidly and by the following day appeared to be at baseline. She will be discharged home today with insulin.  I have called and spoken with her provider at [facility] who deferred dosing of her initial regimen to our team. They will follow-up and titrate her medications as indicated.   Plan: DKA vs HHS: CBG >600, urinary ketones, anion gap minus metabolic acidosis on BMP concerning for DKA on admission. The patient demonstrated sluggish verbal responses which has  been confirmed to be baseline.  --GAP closed x 2, insulin GTT discontinued placed on Latus 10, aspart 3U TID w/ meals and SSI sens --Potasium stable, given a dose of 10mEq PO today --HgbA1c 13.3  --Repleted potassium as indicated   Acute renal injury: Resolved Most likely due to prerenal 2/2 volume depletion w/ a BUN/Cr of ~17 on admission. Cr 1.13 today.   HTN: BP 114/69. Will continue to monitor while holding clonidine.  --Resume amlodipine 10mg  daily --Lopressor 25mg  BID  Hx of CVA: No acute focal deficits but baseline unclear. --Continue atorvastatin and dipyridamole-ASA  Diet: Carb modified Code: Prior Fluids: n/a VTE ppx: enoxaparin  Dispo: Anticipated discharge in approximately 0-1 day(s).   Kathi Ludwig, MD 10/19/2017, 1:04 PM Pager: Pager# [phone]

## 2017-10-19 NOTE — ED Notes (Signed)
Requested Lab pull BMP

## 2017-10-19 NOTE — Progress Notes (Signed)
  Date: 10/19/2017  Patient name: Tanee Henery  Medical record number: [id number]  Date of birth: 21-Dec-1950   I saw and evaluated the patient. I reviewed the resident's note and I agree with the resident's findings and plan as documented in the resident's note.  Chief Complaint(s): High blood sugar  History - key components related to admission:  Please see resident note for details.  Briefly, Ms. Sheffer is a 67 yo woman with a history of T2DM, HTN, stroke, and arthritis coming in with glucose >600. She has not had a meter in a while, obtained one a week ago, and checked her sugar the day of admission, then presented to the hospital when it was so high. She has been very thirsty, but no other recent symptoms.   She takes metformin and [place]. She was on insulin 7 or 8 years ago, but was taken off because she didn't need it.   Physical Exam - key components related to admission:  Vitals:   10/19/17 1330 10/19/17 1400 10/19/17 1500 10/19/17 1530  BP: (!) 110/54 119/67 120/69 101/84  Pulse:      Resp: 17 10 15 12   Temp:      TempSrc:      SpO2:      Weight:      Height:        General: Alert, pleasant, not in distress HEENT: Moist mucous membranes Neck: No JVD, no lymphadenopathy Cardiovascular: Regular rate and rhythm, no murmurs rubs or gallops Pulmonary: Clear to auscultation bilaterally, normal work of breathing Abdominal: Obese, soft, nontender, not distended, normal bowel sounds Extremities: Warm, no edema Skin: No significant rashes or lesions Neuro: Alert and oriented, speech slow but coherent  Significant test results: Glucose 676 (initial) VBG 7.28/47 Bicarb 22 --> 20 --> 22 Gap 17 --> 7 K 4.1 --> 3.5 A1C 13.3 Urine 20 ketones, 0-5 WBCs  Assessment and Plan: I have seen and evaluated the patient as outlined in the resident's note. I agree with the formulated Assessment and Plan as detailed in the resident's note, with the following changes:   Principal  Problem:   Diabetic ketoacidosis ([place]) Active Problems:   Hypertension   History of stroke   DKA (diabetic ketoacidoses) ([place])   1. DKA: presenting with mild DKA, very minimal acidosis. Gap has closed, glucose at goal. Received adequate fluid resuscitation, appears euvolemic with good UOP today. Potassium dropped a little, as expected, anticipatory repletion helped stabilize, will continue to correct hypokalemia today. Likely discharge after discussing care plan with her PCP at [facility], likely with resumption of insulin.   Oda Kilts, MD 4/29/20194:10 PM

## 2017-10-19 NOTE — ED Notes (Signed)
Pt pulled up in bed. Assisted with meal setup. Pt eating her breakfast.

## 2017-10-19 NOTE — Progress Notes (Signed)
Inpatient Diabetes Program Recommendations  AACE/ADA: New Consensus Statement on Inpatient Glycemic Control (2015)  Target Ranges:  Prepandial:   less than 140 mg/dL      Peak postprandial:   less than 180 mg/dL (1-2 hours)      Critically ill patients:  140 - 180 mg/dL   Lab Results  Component Value Date   GLUCAP 153 (H) 10/19/2017   HGBA1C 13.3 (H) 10/18/2017    Review of Glycemic Control Results for [name] [name], [name] [name] (MRN [id number]) as of 10/19/2017 09:30  Ref. Range 10/19/2017 04:27 10/19/2017 05:31 10/19/2017 06:43 10/19/2017 07:45  Glucose-Capillary Latest Ref Range: 65 - 99 mg/dL 139 (H) 160 (H) 179 (H) 153 (H)   Diabetes history: Type 2 DM Outpatient Diabetes medications: Januvia 500 mg QD, Metformin 500 mg Q other day Current orders for Inpatient glycemic control: Novolog 0-9 units TID, Novolog 3 units TID  Inpatient Diabetes Program Recommendations:    Please consider adding Lantus 10 units QD (113.6 kg x 0.1) as patient has received a total of 52 units over last 24 hours (and will most likely need adjusting during inpatient). Will plan to see patient today.  Thanks, Bronson Curb, MSN, RNC-OB Diabetes Coordinator [phone] (8a-5p)

## 2017-10-19 NOTE — ED Notes (Signed)
Pt complains of back pain, needs pain medication.  RN to page provider

## 2017-10-19 NOTE — Discharge Summary (Signed)
Name: [name] [name] MRN: [id number] DOB: [birth year] 67 y.o. PCP: [name] Adie, MD  Date of Admission: 10/18/2017 12:15 PM Date of Discharge: 4/29/201904/29/2018 Attending Physician: Dr. Rebeca Alert  Discharge Diagnosis: Principal Problem:   Diabetic ketoacidosis ([place]) Active Problems:   Hypertension   History of stroke   DKA (diabetic ketoacidoses) [facility])  Discharge Medications: Allergies as of 10/19/2017      Reactions   Ciprofloxacin Hives      Medication List    TAKE these medications   acetaminophen 500 MG tablet Commonly known as:  TYLENOL Take 500 mg by mouth at bedtime.   AGGRENOX 200-25 MG 12hr capsule Generic drug:  dipyridamole-aspirin Take 1 capsule by mouth 2 (two) times daily. Stroke prevention   amLODipine 10 MG tablet Commonly known as:  NORVASC Take 10 mg by mouth daily. hypertension   atorvastatin 20 MG tablet Commonly known as:  LIPITOR Take 20 mg by mouth daily.   BALMEX EX Apply 1 application topically 2 (two) times daily as needed (incontinence).   BIOTENE DRY MOUTH MOISTURIZING Soln Use as directed 1 spray in the mouth or throat 6 (six) times daily.   buPROPion 300 MG 24 hr tablet Commonly known as:  WELLBUTRIN XL Take 300 mg by mouth daily. depression   cloNIDine 0.1 mg/24hr patch Commonly known as:  CATAPRES - Dosed in mg/24 hr Place 0.1 mg onto the skin every Sunday. hypertension   gabapentin 300 MG capsule Commonly known as:  NEURONTIN Take 300 mg by mouth at bedtime.   hydrochlorothiazide 25 MG tablet Commonly known as:  HYDRODIURIL Take 25 mg by mouth daily. CHF   insulin aspart 100 UNIT/ML FlexPen Commonly known as:  NOVOLOG FLEXPEN Inject 5 Units into the skin 3 (three) times daily with meals.   Insulin Glargine 100 UNIT/ML Solostar Pen Commonly known as:  LANTUS SOLOSTAR Inject 15 Units into the skin daily at 10 pm.   metFORMIN 500 MG tablet Commonly known as:  GLUCOPHAGE Take 500 mg by mouth every other  day.   metoprolol tartrate 25 MG tablet Commonly known as:  LOPRESSOR Take 25 mg by mouth 2 (two) times daily. hypertension   nitroGLYCERIN 0.4 MG SL tablet Commonly known as:  NITROSTAT Place 0.4 mg under the tongue every 5 (five) minutes as needed for chest pain.   polyethylene glycol packet Commonly known as:  MIRALAX / GLYCOLAX Take 17 g by mouth daily as needed (constipation). Mix in 8 oz liquid and drink   potassium chloride 10 MEQ tablet Commonly known as:  K-DUR,KLOR-CON Take 10 mEq by mouth daily.   rOPINIRole 0.25 MG tablet Commonly known as:  REQUIP Take 0.25 mg by mouth at bedtime.   simethicone 125 MG chewable tablet Commonly known as:  MYLICON Chew 433 mg by mouth 3 (three) times daily as needed for flatulence (before meals as needed).   sitaGLIPtin 50 MG tablet Commonly known as:  JANUVIA Take 50 mg by mouth daily.      Disposition and follow-up:   Ms.[name] [name] was discharged from [facility] in Stable condition.  At the hospital follow up visit please address:  1.  Hyperglycemia with elevated HgbA1c of recent. Presented in diabetic ketoacidosis with urine positive for Ketones, anion gap of 17, CBG >600, mild acidosis. She responded well to continuous insulin infusion, fluids and potassium. She was transitioned to Lantus 15U and Aspart 5U TID with meals and instructed to check her CBG TID.   2.  Labs / imaging  needed at time of follow-up: CBG, HgbA1c in 3 months, BMP?  3.  Pending labs/ test needing follow-up: n/a  Follow-up Appointments: Follow-up [employer], [place]. Go to.   Why:  Tomorrow Contact information: [place] [id number] [place] Hospital Course by problem list: Principal Problem:   Diabetic ketoacidosis ([place]) Active Problems:   Hypertension   History of stroke   DKA (diabetic ketoacidoses) ([place])   DKA:  This is a 67 yo F with a  PMHx most notable for DMII, HTN, prior CVA with residual mobility deficits and lower extremity weakness, who presented for hyperglycemia and increased thirst. Her presentation is concerning for DKA vs HHS given the the hyperglycemia, mild ketonuria, anion gap, increased thirst, dehydration and sluggish verbal responses. Absent focal deficits on neuro exam, CVA was of less concern. She improved rapidly with regard to her anion gap and CBG readings stabilizing on basal bolus insulin. Her mental status was alert and oriented with delayed responses which as per her PCP at [facility] is baseline as described. Her planned insulin regimen was discussed with her outpatient provider as well who agreed to titrate the insulin initiated as indicated by her CBG readings on her machine. Our clinical pharmacist demonstrated the proper technique to administer her insulin. She will continue to receive training at [facility].  Plan for insulin treatment initiation with Lantus 15U ~ equal to 13% x body weight in kg converted to Units of insulin. She was also discharged with bolus 5U TID. Please continue to adjust these levels as indicated.   Acute renal injury: Resolved Most likely due to prerenal 2/2 volume depletion w/ a BUN/Cr of ~17 on admission. Cr 1.13 today on discharge.   Discharge Vitals:   BP 131/69   Pulse 76   Temp 97.6 F (36.4 C) (Oral)   Resp 14   Ht 5\' 7"  (1.702 m)   Wt 250 lb (113.4 kg)   SpO2 94%   BMI 39.16 kg/m   Pertinent Labs, Studies, and Procedures:  CBC Latest Ref Rng & Units 10/18/2017  WBC 4.0 - 10.5 K/uL 7.2  Hemoglobin 12.0 - 15.0 g/dL 12.6  Hematocrit 36.0 - 46.0 % 39.7  Platelets 150 - 400 K/uL 229   CMP Latest Ref Rng & Units 10/19/2017 10/19/2017 10/18/2017  Glucose 65 - 99 mg/dL 166(H) 148(H) 443(H)  BUN 6 - 20 mg/dL 20 20 27(H)  Creatinine 0.44 - 1.00 mg/dL 1.13(H) 1.17(H) 1.71(H)  Sodium 135 - 145 mmol/L 135 138 137  Potassium 3.5 - 5.1 mmol/L 3.5 3.4(L) 3.6  Chloride 101 - 111  mmol/L 106 107 100(L)  CO2 22 - 32 mmol/L 22 23 22   Calcium 8.9 - 10.3 mg/dL 8.9 8.8(L) 9.4   Discharge Instructions: Discharge Instructions    Diet - low sodium heart healthy   Complete by:  As directed    Increase activity slowly   Complete by:  As directed       Signed: Kathi Ludwig, MD 10/20/2017, 1:03 PM   Pager: Pager# [phone]

## 2017-10-19 NOTE — ED Notes (Signed)
Daughter called and told [facility] is here to transport patient home, daughter there to help unlock door

## 2017-10-19 NOTE — Progress Notes (Signed)
Inpatient Diabetes Program Recommendations  AACE/ADA: New Consensus Statement on Inpatient Glycemic Control (2015)  Target Ranges:  Prepandial:   less than 140 mg/dL      Peak postprandial:   less than 180 mg/dL (1-2 hours)      Critically ill patients:  140 - 180 mg/dL   Lab Results  Component Value Date   GLUCAP 233 (H) 10/19/2017   HGBA1C 13.3 (H) 10/18/2017    Review of Glycemic Control Results for [name], [name] (MRN [id number]) as of 10/19/2017 16:56  Ref. Range 10/19/2017 04:27 10/19/2017 05:31 10/19/2017 06:43 10/19/2017 07:45 10/19/2017 11:31  Glucose-Capillary Latest Ref Range: 65 - 99 mg/dL 139 (H) 160 (H) 179 (H) 153 (H) 233 (H)   Diabetes history: Type 2 DM Outpatient Diabetes medications: Metformin 500 mg Q other D, Januvia 50 mg QD Current orders for Inpatient glycemic control: Novolog 0-9 units TID, Novolog 3 unit TID  Inpatient Diabetes Program Recommendations:    @ 1430 Spoke with patient regarding home regimen. Patient lives with daughter and has previously been on insulin. However, she states, "My doctor in [place] told me my diabetes was better so I came off my insulin."   Reviewed patient's current A1c of 13.3%. Explained what a A1c is and what it measures. Also reviewed goal A1c with patient, importance of good glucose control @ home, and blood sugar goals.  Educated patient on insulin pen use at home. Reviewed contents of insulin flexpen starter kit. Reviewed all steps if insulin pen including attachment of needle, 2-unit air shot, dialing up dose, giving injection, removing needle, disposal of sharps, storage of unused insulin, disposal of insulin etc. Took patient quite a while to provide demonstration on rotating of dial on pen. Was not able to provide full return demonstration. Plan to reassess and practice throughout inpatient stay. Also reviewed troubleshooting with insulin pen. MD to give patient Rxs for insulin pens and insulin pen needles. Patient has a  meter and plans to begin checking BS twice daily.   @1700: Spoke with Dr Harbrecht regarding discharge plan, as noted by discharge summary. Expressed concerns related to patient self injecting, given earlier assessment. Felt the need for additional time for teaching and assessing ability to perform injections. Per Dr Harbrecht patient is to follow up in the AM for additional teaching and to ensure proper insulin admin, pharmacy provided additional teaching and that patient's PACE worker feels that patient is able to provide self care with injections.   Thanks, Lauren McDaniel, MSN, RNC-OB Diabetes Coordinator [phone] (8a-5p)   

## 2018-09-02 ENCOUNTER — Emergency Department (HOSPITAL_COMMUNITY): Payer: Medicare (Managed Care)

## 2018-09-02 ENCOUNTER — Emergency Department (HOSPITAL_COMMUNITY)
Admission: EM | Admit: 2018-09-02 | Discharge: 2018-09-02 | Disposition: A | Discharge status: Home / Self Care | Payer: Medicare (Managed Care) | Attending: Emergency Medicine | Admitting: Emergency Medicine

## 2018-09-02 DIAGNOSIS — I10 Essential (primary) hypertension: Secondary | ICD-10-CM | POA: Insufficient documentation

## 2018-09-02 DIAGNOSIS — Z794 Long term (current) use of insulin: Secondary | ICD-10-CM | POA: Diagnosis not present

## 2018-09-02 DIAGNOSIS — R079 Chest pain, unspecified: Secondary | ICD-10-CM | POA: Diagnosis present

## 2018-09-02 DIAGNOSIS — R400 Somnolence: Secondary | ICD-10-CM

## 2018-09-02 DIAGNOSIS — E119 Type 2 diabetes mellitus without complications: Secondary | ICD-10-CM | POA: Insufficient documentation

## 2018-09-02 LAB — URINALYSIS, ROUTINE W REFLEX MICROSCOPIC
Bilirubin Urine: NEGATIVE
Glucose, UA: NEGATIVE mg/dL
Hgb urine dipstick: NEGATIVE
Ketones, ur: NEGATIVE mg/dL
LEUKOCYTE UA: NEGATIVE
NITRITE: NEGATIVE
PH: 7 (ref 5.0–8.0)
Protein, ur: NEGATIVE mg/dL
SPECIFIC GRAVITY, URINE: 1.017 (ref 1.005–1.030)

## 2018-09-02 LAB — CBC WITH DIFFERENTIAL/PLATELET
ABS IMMATURE GRANULOCYTES: 0.03 10*3/uL (ref 0.00–0.07)
Basophils Absolute: 0 10*3/uL (ref 0.0–0.1)
Basophils Relative: 0 %
EOS ABS: 0.2 10*3/uL (ref 0.0–0.5)
Eosinophils Relative: 3 %
HCT: 36.2 % (ref 36.0–46.0)
Hemoglobin: 10.4 g/dL — ABNORMAL LOW (ref 12.0–15.0)
IMMATURE GRANULOCYTES: 0 %
LYMPHS ABS: 1.2 10*3/uL (ref 0.7–4.0)
Lymphocytes Relative: 16 %
MCH: 28.2 pg (ref 26.0–34.0)
MCHC: 28.7 g/dL — ABNORMAL LOW (ref 30.0–36.0)
MCV: 98.1 fL (ref 80.0–100.0)
MONO ABS: 0.4 10*3/uL (ref 0.1–1.0)
MONOS PCT: 5 %
NEUTROS PCT: 76 %
Neutro Abs: 6 10*3/uL (ref 1.7–7.7)
Platelets: 235 10*3/uL (ref 150–400)
RBC: 3.69 MIL/uL — ABNORMAL LOW (ref 3.87–5.11)
RDW: 13.3 % (ref 11.5–15.5)
WBC: 7.9 10*3/uL (ref 4.0–10.5)
nRBC: 0 % (ref 0.0–0.2)

## 2018-09-02 LAB — BASIC METABOLIC PANEL
Anion gap: 13 (ref 5–15)
BUN: 27 mg/dL — AB (ref 8–23)
CO2: 22 mmol/L (ref 22–32)
CREATININE: 1.39 mg/dL — AB (ref 0.44–1.00)
Calcium: 9.4 mg/dL (ref 8.9–10.3)
Chloride: 106 mmol/L (ref 98–111)
GFR calc Af Amer: 45 mL/min — ABNORMAL LOW (ref >60)
GFR, EST NON AFRICAN AMERICAN: 39 mL/min — AB (ref >60)
GLUCOSE: 76 mg/dL (ref 70–99)
POTASSIUM: 4.4 mmol/L (ref 3.5–5.1)
SODIUM: 141 mmol/L (ref 135–145)

## 2018-09-02 LAB — TROPONIN I: Troponin I: <0.03 ng/mL (ref <0.03)

## 2018-09-02 LAB — LACTIC ACID, PLASMA: Lactic Acid, Venous: 0.8 mmol/L (ref 0.5–1.9)

## 2018-09-02 MED ORDER — ASPIRIN 81 MG PO CHEW
324.0000 mg | CHEWABLE_TABLET | Freq: Once | ORAL | Status: AC
  Administered 2018-09-02: 324 mg via ORAL
  Filled 2018-09-02: qty 4

## 2018-09-02 MED ORDER — SODIUM CHLORIDE 0.9 % IV BOLUS
500.0000 mL | Freq: Once | INTRAVENOUS | Status: AC
  Administered 2018-09-02: 500 mL via INTRAVENOUS

## 2018-09-02 NOTE — ED Notes (Signed)
Called [facility]

## 2018-09-02 NOTE — ED Notes (Signed)
Next on list for [facility]

## 2018-09-02 NOTE — Discharge Instructions (Addendum)
You were seen in the emergency department for chest pain.  You had blood work EKG chest x-ray that did not show an obvious explanation for your pain.  You very sleepy here in the emergency department and we did other testing look for infection and did not find an obvious cause of that.  Your symptoms improved while you were here and you felt you could return home.  Please follow-up with your doctor and return if any worsening symptoms.

## 2018-09-02 NOTE — ED Provider Notes (Signed)
[facility]ERGENC[facility] DEPARTMENT Provider Note   CSN: [id number] Arrival date & time: 09/02/18  0847    History   Chief Complaint Chief Complaint  Patient presents with  . Chest Pain    HPI [name] [name] is a 68 y.o. female.  She is presenting by EMS with a complaint of chest pain that started around 6 AM today.  She has a history of coronary disease and has cardiac stents although she says this is a long time ago.  She is a rather poor historian.  She rates the pain is 5 out of 10.  It is associated with nausea.  No shortness of breath no dizziness no fevers no chills no vomiting.  No radiation to arms neck or back.  She says she gets this pain 2 or 3 times a year.  EMS tried to give her aspirin but she spit it back out.  They were able to give her 4 mg of IV Zofran.  She said her chest pain is still 5 out of 10 currently.     The history is provided by the patient.  Chest Pain  Pain location:  Substernal area Pain quality: pressure   Pain radiates to:  Does not radiate Pain severity:  Moderate Onset quality:  Sudden Timing:  Constant Progression:  Unchanged Chronicity:  Recurrent Context: not breathing and not lifting   Relieved by:  None tried Worsened by:  Nothing Ineffective treatments:  None tried Associated symptoms: fatigue, nausea and vomiting   Associated symptoms: no abdominal pain, no cough, no diaphoresis, no fever, no headache, no shortness of breath and no syncope   Risk factors: diabetes mellitus and hypertension     Past Medical History:  Diagnosis Date  . Arthritis   . Diabetes mellitus without complication ([place])   . Stroke [facility])     Patient Active Problem List   Diagnosis Date Noted  . DKA (diabetic ketoacidoses) ([place]) 10/19/2017  . Diabetic ketoacidosis ([place]) 10/18/2017  . Hypertension 10/18/2017  . History of stroke 10/18/2017    No past surgical history on file.   OB History   No obstetric history on file.       Home Medications    Prior to Admission medications   Medication Sig Start Date End Date Taking? Authorizing Provider  acetaminophen (TYLENOL) 500 MG tablet Take 500 mg by mouth at bedtime.    [provider]  amLODipine (NORVASC) 10 MG tablet Take 10 mg by mouth daily. hypertension    [provider]  Artificial Saliva (BIOTENE DRY MOUTH MOISTURIZING) SOLN Use as directed 1 spray in the mouth or throat 6 (six) times daily.    [provider]  atorvastatin (LIPITOR) 20 MG tablet Take 20 mg by mouth daily.    [provider]  buPROPion (WELLBUTRIN XL) 300 MG 24 hr tablet Take 300 mg by mouth daily. depression    [provider]  cloNIDine (CATAPRES - DOSED IN MG/24 HR) 0.1 mg/24hr patch Place 0.1 mg onto the skin every Sunday. hypertension    [provider]  dipyridamole-aspirin (AGGRENOX) 200-25 MG 12hr capsule Take 1 capsule by mouth 2 (two) times daily. Stroke prevention    [provider]  gabapentin (NEURONTIN) 300 MG capsule Take 300 mg by mouth at bedtime.    [provider]  hydrochlorothiazide (HYDRODIURIL) 25 MG tablet Take 25 mg by mouth daily. CHF    [provider]  insulin aspart (NOVOLOG FLEXPEN) 100 UNIT/ML FlexPen Inject  5 Units into the skin 3 (three) times daily with meals. 10/19/17   Kathi Ludwig, MD  Insulin Glargine (LANTUS SOLOSTAR) 100 UNIT/ML Solostar Pen Inject 15 Units into the skin daily at 10 pm. 10/19/17   Kathi Ludwig, MD  metFORMIN (GLUCOPHAGE) 500 MG tablet Take 500 mg by mouth every other day.    [provider]  metoprolol tartrate (LOPRESSOR) 25 MG tablet Take 25 mg by mouth 2 (two) times daily. hypertension    [provider]  nitroGLYCERIN (NITROSTAT) 0.4 MG SL tablet Place 0.4 mg under the tongue every 5 (five) minutes as needed for chest pain.    [provider]  polyethylene glycol (MIRALAX / GLYCOLAX) packet Take 17 g by mouth daily as  needed (constipation). Mix in 8 oz liquid and drink    [provider]  potassium chloride (K-DUR,KLOR-CON) 10 MEQ tablet Take 10 mEq by mouth daily.    [provider]  rOPINIRole (REQUIP) 0.25 MG tablet Take 0.25 mg by mouth at bedtime.    [provider]  simethicone (MYLICON) 409 MG chewable tablet Chew 125 mg by mouth 3 (three) times daily as needed for flatulence (before meals as needed).    [provider]  sitaGLIPtin (JANUVIA) 50 MG tablet Take 50 mg by mouth daily.    [provider]  Zinc Oxide (BALMEX EX) Apply 1 application topically 2 (two) times daily as needed (incontinence).    [provider]    Family History No family history on file.  Social History Social History   Tobacco Use  . Smoking status: Not on file  Substance Use Topics  . Alcohol use: Not on file  . Drug use: Not on file   Denies tobacco  Allergies   Ciprofloxacin   Review of Systems Review of Systems  Constitutional: Positive for fatigue. Negative for diaphoresis and fever.  HENT: Negative for sore throat.   Eyes: Negative for visual disturbance.  Respiratory: Negative for cough and shortness of breath.   Cardiovascular: Positive for chest pain. Negative for syncope.  Gastrointestinal: Positive for nausea and vomiting. Negative for abdominal pain.  Genitourinary: Negative for dysuria.  Musculoskeletal: Negative for neck pain.  Skin: Negative for rash.  Neurological: Negative for headaches.     Physical Exam Updated Vital Signs BP (!) 106/48 (BP Location: Right Arm)   Pulse 89   Temp 97.8 F (36.6 C)   Resp 16   SpO2 94%   Physical Exam Vitals signs and nursing note reviewed.  Constitutional:      General: She is not in acute distress.    Appearance: She is well-developed.  HENT:     Head: Normocephalic and atraumatic.  Eyes:     Conjunctiva/sclera: Conjunctivae normal.  Neck:     Musculoskeletal: Neck supple.   Cardiovascular:     Rate and Rhythm: Normal rate and regular rhythm.     Heart sounds: Normal heart sounds. No murmur.  Pulmonary:     Effort: Pulmonary effort is normal. No respiratory distress.     Breath sounds: Normal breath sounds.  Abdominal:     Palpations: Abdomen is soft.     Tenderness: There is no abdominal tenderness.  Musculoskeletal:     Right lower leg: She exhibits no tenderness.     Left lower leg: She exhibits no tenderness.  Skin:    General: Skin is warm and dry.     Capillary Refill: Capillary refill takes less than 2 seconds.  Neurological:  General: No focal deficit present.     Mental Status: She is alert and oriented to person, place, and time.      ED Treatments / Results  Labs (all labs ordered are listed, but only abnormal results are displayed) Labs Reviewed  BASIC METABOLIC PANEL - Abnormal; Notable for the following components:      Result Value   BUN 27 (*)    Creatinine, Ser 1.39 (*)    GFR calc non Af Amer 39 (*)    GFR calc Af Amer 45 (*)    All other components within normal limits  CBC WITH DIFFERENTIAL/PLATELET - Abnormal; Notable for the following components:   RBC 3.69 (*)    Hemoglobin 10.4 (*)    MCHC 28.7 (*)    All other components within normal limits  URINALYSIS, ROUTINE W REFLEX MICROSCOPIC - Abnormal; Notable for the following components:   APPearance CLOUDY (*)    All other components within normal limits  URINE CULTURE  CULTURE, BLOOD (ROUTINE X 2)  CULTURE, BLOOD (ROUTINE X 2)  TROPONIN I  LACTIC ACID, PLASMA  TROPONIN I    EKG EKG Interpretation  Date/Time:  Thursday September 02 2018 08:48:53 EDT Ventricular Rate:  91 PR Interval:    QRS Duration: 82 QT Interval:  365 QTC Calculation: 450 R Axis:   34 Text Interpretation:  Sinus rhythm Borderline T abnormalities, anterior leads no prior to compare with Confirmed by Aletta Edouard [phone]) on 09/02/2018 10:08:46 AM   Radiology Dg Chest Port 1 View   Result Date: 09/02/2018 CLINICAL DATA:  Center chest pain and SOB since this AM. Hx of DM, stroke. EXAM: PORTABLE CHEST 1 VIEW COMPARISON:  07/03/2017 FINDINGS: Heart is enlarged and stable in configuration. No focal consolidations or pleural effusions. No pulmonary edema. There is atherosclerotic calcification of the thoracic aorta. IMPRESSION: Stable cardiomegaly. Electronically Signed   By: Nolon Nations M.D.   On: 09/02/2018 10:34    Procedures Procedures (including critical care time)  Medications Ordered in ED Medications  sodium chloride 0.9 % bolus 500 mL (has no administration in time range)  aspirin chewable tablet 324 mg (has no administration in time range)     Initial Impression / Assessment and Plan / ED Course  I have reviewed the triage vital signs and the nursing notes.  Pertinent labs & imaging results that were available during my care of the patient were reviewed by me and considered in my medical decision making (see chart for details).  Clinical Course as of Sep 03 1535  Thu Sep 01, 5809  1771 68 year old with known CAD here with chest pain since 6 AM.  Was a little slow to respond.  Lives with her son EMS said the place was fairly well kept up.  He is usually bedbound or wheelchair and does not ambulate much.  Her son is on his way in or not.   [MB]  0908 ED ECG REPORT   Date: @EDTODAY @9am   Rate: nsr  Rhythm: normal sinus rhythm  QRS Axis: normal  Intervals: PR prolonged  ST/T Wave abnormalities: nonspecific ST changes and nonspecific T wave changes  Conduction Disutrbances:first-degree A-V block   Narrative Interpretation:   Old EKG Reviewed: none available  I have personally reviewed the EKG tracing and agree with the computerized printout as noted.    [MB]  1009 Patient's lab work back and fairly unremarkable.  Her troponin is normal and she has a normal white count.  Pressures drifting down and  so added on blood cultures lactate urine analysis and  urine cultures   [MB]  1518 Trying to reach family to see if they are able to take the patient home.  She remained sleepy but easily arousable and nothing has been found so far with her work-up.  She says her chest pain is resolved.  Delta troponin negative.   [MB]  0223 Patient has been up and away because found herself.  The nurse was able to reach out to family and they said there is somebody at home and she can come back by ambulance.   [MB]    Clinical Course User Index [MB] Hayden Rasmussen, MD   All imaging, labs resulted and ecgs were reviewed independently by me.   ddx - cardiac ischemia, pneumonia, pneumothorax, anemia, infection  Final Clinical Impressions(s) / ED Diagnoses   Final diagnoses:  Nonspecific chest pain  Somnolence    ED Discharge Orders    None       Hayden Rasmussen, MD 09/03/18 1542

## 2018-09-02 NOTE — ED Notes (Signed)
Spoke with latisha, (daugther ) to confirm  That someone would be at the patients residence and she confirmed that the  Patients son and daughter are already there for when she gets discharged

## 2018-09-02 NOTE — ED Triage Notes (Signed)
Pt brought in by ems for /o non radiating left sided chest pain that began this morning around 6am ; pt has had 3 stents placed in the past ; per ems 12 lead unremarkable ; ems reports that they attempted to give 324 of asa but patient spit it back out ; ems gave 4 mg of zofran

## 2018-09-02 NOTE — ED Notes (Signed)
Per MD , ok to feed patient ; gave patient some crackers and ginger ale and patient alert and feeding herself at this time

## 2018-09-03 LAB — URINE CULTURE
CULTURE: NO GROWTH
Special Requests: NORMAL

## 2018-09-07 LAB — CULTURE, BLOOD (ROUTINE X 2)
CULTURE: NO GROWTH
CULTURE: NO GROWTH

## 2018-11-18 ENCOUNTER — Emergency Department (HOSPITAL_COMMUNITY): Payer: Medicare (Managed Care)

## 2018-11-18 ENCOUNTER — Inpatient Hospital Stay (HOSPITAL_COMMUNITY)
Admission: EM | Admit: 2018-11-18 | Discharge: 2018-11-20 | DRG: 682 | Disposition: A | Discharge status: Home / Self Care | Payer: Medicare (Managed Care) | Attending: Internal Medicine | Admitting: Internal Medicine

## 2018-11-18 ENCOUNTER — Encounter (HOSPITAL_COMMUNITY): Payer: Self-pay | Admitting: Emergency Medicine

## 2018-11-18 ENCOUNTER — Other Ambulatory Visit: Payer: Self-pay

## 2018-11-18 DIAGNOSIS — N179 Acute kidney failure, unspecified: Secondary | ICD-10-CM | POA: Diagnosis present

## 2018-11-18 DIAGNOSIS — E860 Dehydration: Secondary | ICD-10-CM | POA: Diagnosis present

## 2018-11-18 DIAGNOSIS — N184 Chronic kidney disease, stage 4 (severe): Secondary | ICD-10-CM | POA: Diagnosis present

## 2018-11-18 DIAGNOSIS — Z79899 Other long term (current) drug therapy: Secondary | ICD-10-CM | POA: Diagnosis not present

## 2018-11-18 DIAGNOSIS — IMO0002 Reserved for concepts with insufficient information to code with codable children: Secondary | ICD-10-CM | POA: Diagnosis present

## 2018-11-18 DIAGNOSIS — Z794 Long term (current) use of insulin: Secondary | ICD-10-CM | POA: Diagnosis not present

## 2018-11-18 DIAGNOSIS — L259 Unspecified contact dermatitis, unspecified cause: Secondary | ICD-10-CM | POA: Diagnosis not present

## 2018-11-18 DIAGNOSIS — I129 Hypertensive chronic kidney disease with stage 1 through stage 4 chronic kidney disease, or unspecified chronic kidney disease: Secondary | ICD-10-CM | POA: Diagnosis present

## 2018-11-18 DIAGNOSIS — R55 Syncope and collapse: Secondary | ICD-10-CM | POA: Diagnosis present

## 2018-11-18 DIAGNOSIS — L89322 Pressure ulcer of left buttock, stage 2: Secondary | ICD-10-CM | POA: Diagnosis present

## 2018-11-18 DIAGNOSIS — R1032 Left lower quadrant pain: Secondary | ICD-10-CM | POA: Diagnosis not present

## 2018-11-18 DIAGNOSIS — Z8673 Personal history of transient ischemic attack (TIA), and cerebral infarction without residual deficits: Secondary | ICD-10-CM

## 2018-11-18 DIAGNOSIS — I69351 Hemiplegia and hemiparesis following cerebral infarction affecting right dominant side: Secondary | ICD-10-CM | POA: Diagnosis not present

## 2018-11-18 DIAGNOSIS — G9341 Metabolic encephalopathy: Secondary | ICD-10-CM | POA: Diagnosis present

## 2018-11-18 DIAGNOSIS — L8990 Pressure ulcer of unspecified site, unspecified stage: Secondary | ICD-10-CM | POA: Diagnosis not present

## 2018-11-18 DIAGNOSIS — L89312 Pressure ulcer of right buttock, stage 2: Secondary | ICD-10-CM | POA: Diagnosis present

## 2018-11-18 DIAGNOSIS — Z1159 Encounter for screening for other viral diseases: Secondary | ICD-10-CM

## 2018-11-18 DIAGNOSIS — E1165 Type 2 diabetes mellitus with hyperglycemia: Secondary | ICD-10-CM | POA: Diagnosis not present

## 2018-11-18 DIAGNOSIS — K5900 Constipation, unspecified: Secondary | ICD-10-CM | POA: Diagnosis not present

## 2018-11-18 DIAGNOSIS — E11649 Type 2 diabetes mellitus with hypoglycemia without coma: Secondary | ICD-10-CM | POA: Diagnosis present

## 2018-11-18 DIAGNOSIS — I2510 Atherosclerotic heart disease of native coronary artery without angina pectoris: Secondary | ICD-10-CM | POA: Diagnosis not present

## 2018-11-18 DIAGNOSIS — E1122 Type 2 diabetes mellitus with diabetic chronic kidney disease: Secondary | ICD-10-CM | POA: Diagnosis present

## 2018-11-18 DIAGNOSIS — R21 Rash and other nonspecific skin eruption: Secondary | ICD-10-CM

## 2018-11-18 DIAGNOSIS — R109 Unspecified abdominal pain: Secondary | ICD-10-CM

## 2018-11-18 DIAGNOSIS — I447 Left bundle-branch block, unspecified: Secondary | ICD-10-CM | POA: Diagnosis not present

## 2018-11-18 DIAGNOSIS — E785 Hyperlipidemia, unspecified: Secondary | ICD-10-CM | POA: Diagnosis not present

## 2018-11-18 DIAGNOSIS — E875 Hyperkalemia: Secondary | ICD-10-CM | POA: Diagnosis present

## 2018-11-18 DIAGNOSIS — I10 Essential (primary) hypertension: Secondary | ICD-10-CM | POA: Diagnosis not present

## 2018-11-18 LAB — COMPREHENSIVE METABOLIC PANEL
ALT: 11 U/L (ref 0–44)
AST: 16 U/L (ref 15–41)
Albumin: 3.3 g/dL — ABNORMAL LOW (ref 3.5–5.0)
Alkaline Phosphatase: 66 U/L (ref 38–126)
Anion gap: 12 (ref 5–15)
BUN: 46 mg/dL — ABNORMAL HIGH (ref 8–23)
CO2: 18 mmol/L — ABNORMAL LOW (ref 22–32)
Calcium: 9.1 mg/dL (ref 8.9–10.3)
Chloride: 107 mmol/L (ref 98–111)
Creatinine, Ser: 2.82 mg/dL — ABNORMAL HIGH (ref 0.44–1.00)
GFR calc Af Amer: 19 mL/min — ABNORMAL LOW (ref >60)
GFR calc non Af Amer: 17 mL/min — ABNORMAL LOW (ref >60)
Glucose, Bld: 97 mg/dL (ref 70–99)
Potassium: 6.3 mmol/L (ref 3.5–5.1)
Sodium: 137 mmol/L (ref 135–145)
Total Bilirubin: 0.7 mg/dL (ref 0.3–1.2)
Total Protein: 7 g/dL (ref 6.5–8.1)

## 2018-11-18 LAB — CBC WITH DIFFERENTIAL/PLATELET
Abs Immature Granulocytes: 0.16 10*3/uL — ABNORMAL HIGH (ref 0.00–0.07)
Basophils Absolute: 0 10*3/uL (ref 0.0–0.1)
Basophils Relative: 0 %
Eosinophils Absolute: 0.4 10*3/uL (ref 0.0–0.5)
Eosinophils Relative: 5 %
HCT: 39.2 % (ref 36.0–46.0)
Hemoglobin: 12.2 g/dL (ref 12.0–15.0)
Immature Granulocytes: 2 %
Lymphocytes Relative: 10 %
Lymphs Abs: 0.9 10*3/uL (ref 0.7–4.0)
MCH: 29.3 pg (ref 26.0–34.0)
MCHC: 31.1 g/dL (ref 30.0–36.0)
MCV: 94.2 fL (ref 80.0–100.0)
Monocytes Absolute: 0.4 10*3/uL (ref 0.1–1.0)
Monocytes Relative: 4 %
Neutro Abs: 7.2 10*3/uL (ref 1.7–7.7)
Neutrophils Relative %: 79 %
Platelets: 299 10*3/uL (ref 150–400)
RBC: 4.16 MIL/uL (ref 3.87–5.11)
RDW: 14.7 % (ref 11.5–15.5)
WBC: 9.1 10*3/uL (ref 4.0–10.5)
nRBC: 0 % (ref 0.0–0.2)

## 2018-11-18 LAB — TROPONIN I: Troponin I: <0.03 ng/mL (ref <0.03)

## 2018-11-18 LAB — CBG MONITORING, ED
Glucose-Capillary: 81 mg/dL (ref 70–99)
Glucose-Capillary: 84 mg/dL (ref 70–99)

## 2018-11-18 LAB — LACTIC ACID, PLASMA
Lactic Acid, Venous: 2.3 mmol/L (ref 0.5–1.9)
Lactic Acid, Venous: 2.4 mmol/L (ref 0.5–1.9)

## 2018-11-18 LAB — SARS CORONAVIRUS 2 BY RT PCR (HOSPITAL ORDER, PERFORMED IN ~~LOC~~ HOSPITAL LAB): SARS Coronavirus 2: NEGATIVE

## 2018-11-18 LAB — LIPASE, BLOOD: Lipase: 18 U/L (ref 11–51)

## 2018-11-18 LAB — MAGNESIUM: Magnesium: 2.9 mg/dL — ABNORMAL HIGH (ref 1.7–2.4)

## 2018-11-18 MED ORDER — CLONIDINE HCL 0.1 MG/24HR TD PTWK: 0.1000 mg | MEDICATED_PATCH | TRANSDERMAL | Status: DC

## 2018-11-18 MED ORDER — SODIUM POLYSTYRENE SULFONATE 15 GM/60ML PO SUSP
15.0000 g | Freq: Once | ORAL | Status: AC
  Administered 2018-11-18: 23:00:00 15 g via ORAL
  Filled 2018-11-18: qty 60

## 2018-11-18 MED ORDER — ACETAMINOPHEN 650 MG RE SUPP: 650.0000 mg | Freq: Four times a day (QID) | RECTAL | Status: DC | PRN

## 2018-11-18 MED ORDER — METHYLPREDNISOLONE SODIUM SUCC 125 MG IJ SOLR
80.0000 mg | Freq: Once | INTRAMUSCULAR | Status: AC
  Administered 2018-11-18: 21:00:00 80 mg via INTRAVENOUS
  Filled 2018-11-18: qty 2

## 2018-11-18 MED ORDER — DEXTROSE 50 % IV SOLN
1.0000 | Freq: Once | INTRAVENOUS | Status: AC
  Administered 2018-11-18: 22:00:00 50 mL via INTRAVENOUS
  Filled 2018-11-18: qty 50

## 2018-11-18 MED ORDER — HEPARIN SODIUM (PORCINE) 5000 UNIT/ML IJ SOLN
5000.0000 [IU] | Freq: Three times a day (TID) | INTRAMUSCULAR | Status: DC
  Administered 2018-11-18 – 2018-11-20 (×5): 5000 [IU] via SUBCUTANEOUS
  Filled 2018-11-18 (×5): qty 1

## 2018-11-18 MED ORDER — ATORVASTATIN CALCIUM 10 MG PO TABS
10.0000 mg | ORAL_TABLET | Freq: Every day | ORAL | Status: DC
  Administered 2018-11-19 – 2018-11-20 (×2): 10 mg via ORAL
  Filled 2018-11-18 (×2): qty 1

## 2018-11-18 MED ORDER — ALBUTEROL SULFATE HFA 108 (90 BASE) MCG/ACT IN AERS
4.0000 | INHALATION_SPRAY | Freq: Once | RESPIRATORY_TRACT | Status: AC
  Administered 2018-11-18: 22:00:00 4 via RESPIRATORY_TRACT
  Filled 2018-11-18: qty 6.7

## 2018-11-18 MED ORDER — POLYETHYLENE GLYCOL 3350 17 G PO PACK
17.0000 g | PACK | Freq: Every day | ORAL | Status: DC
  Administered 2018-11-19 – 2018-11-20 (×2): 17 g via ORAL
  Filled 2018-11-18 (×2): qty 1

## 2018-11-18 MED ORDER — ACETAMINOPHEN 325 MG PO TABS: 650.0000 mg | ORAL_TABLET | Freq: Four times a day (QID) | ORAL | Status: DC | PRN

## 2018-11-18 MED ORDER — INSULIN ASPART 100 UNIT/ML IV SOLN
10.0000 [IU] | Freq: Once | INTRAVENOUS | Status: AC
  Administered 2018-11-18: 22:00:00 10 [IU] via INTRAVENOUS

## 2018-11-18 MED ORDER — INSULIN ASPART 100 UNIT/ML ~~LOC~~ SOLN
0.0000 [IU] | SUBCUTANEOUS | Status: DC
  Administered 2018-11-19: 1 [IU] via SUBCUTANEOUS
  Administered 2018-11-19: 7 [IU] via SUBCUTANEOUS
  Administered 2018-11-19: 2 [IU] via SUBCUTANEOUS
  Administered 2018-11-19: 5 [IU] via SUBCUTANEOUS

## 2018-11-18 MED ORDER — SODIUM CHLORIDE 0.9 % IV BOLUS
500.0000 mL | Freq: Once | INTRAVENOUS | Status: AC
  Administered 2018-11-18: 21:00:00 500 mL via INTRAVENOUS

## 2018-11-18 MED ORDER — CALCIUM GLUCONATE 10 % IV SOLN
1.0000 g | Freq: Once | INTRAVENOUS | Status: AC
  Administered 2018-11-18: 22:00:00 1 g via INTRAVENOUS
  Filled 2018-11-18: qty 10

## 2018-11-18 MED ORDER — BUPROPION HCL ER (XL) 150 MG PO TB24
300.0000 mg | ORAL_TABLET | Freq: Every day | ORAL | Status: DC
  Administered 2018-11-19 – 2018-11-20 (×2): 300 mg via ORAL
  Filled 2018-11-18 (×2): qty 2

## 2018-11-18 MED ORDER — METOPROLOL SUCCINATE ER 50 MG PO TB24: 50.0000 mg | ORAL_TABLET | Freq: Every day | ORAL | Status: DC

## 2018-11-18 MED ORDER — ASPIRIN-DIPYRIDAMOLE ER 25-200 MG PO CP12
1.0000 | ORAL_CAPSULE | Freq: Two times a day (BID) | ORAL | Status: DC
  Administered 2018-11-19 – 2018-11-20 (×3): 1 via ORAL
  Filled 2018-11-18 (×5): qty 1

## 2018-11-18 NOTE — ED Provider Notes (Signed)
[place] EMERGENCY DEPARTMENT Provider Note   CSN: [id number] Arrival date & time: 11/18/18  1759    History   Chief Complaint Chief Complaint  Patient presents with  . Weakness    HPI [name] [name] is a 68 y.o. female with history of diabetes mellitus, CVA with residual right-sided deficits, hypertension, HLD presents via EMS for evaluation of acute onset, persistent generalized weakness beginning at around 3 PM today. Patient reports lower abdominal pain, denies nausea, vomiting, urinary symptoms, diarrhea, or constipation.  She is unable to characterize her pain.  She denies chest pain but reports she does have a history of coronary artery disease and has had multiple stents placed though states this was years ago.  She does endorse some shortness of breath primarily at night and with laying flat, denies cough or fever.  She has a pruritic rash but is unsure how long it has been present.  It is generalized, denies pain.  Denies new soaps, shampoos, detergents, or lotions.  No fevers or any known sick contacts.  She reports that the right-sided weakness is chronic and unchanged for her.  Per triage note, patient's family noticed she was lethargic around 4 PM, CBG was checked which was 60.  Patient drank OJ with improvement in her CBG to ED. The patient is a poor historian.      The history is provided by the patient and the EMS personnel.    Past Medical History:  Diagnosis Date  . Arthritis   . Diabetes mellitus without complication ([place])   . Stroke [facility])     Patient Active Problem List   Diagnosis Date Noted  . AKI (acute kidney injury) ([place]) 11/18/2018  . DKA (diabetic ketoacidoses) ([place]) 10/19/2017  . Diabetic ketoacidosis ([place]) 10/18/2017  . Hypertension 10/18/2017  . History of stroke 10/18/2017    History reviewed. No pertinent surgical history.   OB History   No obstetric history on file.      Home Medications    Prior to Admission  medications   Medication Sig Start Date End Date Taking? Authorizing Provider  acetaminophen (TYLENOL) 500 MG tablet Take 500 mg by mouth at bedtime.    [provider]  amLODipine (NORVASC) 10 MG tablet Take 10 mg by mouth daily. hypertension    [provider]  Artificial Saliva (BIOTENE DRY MOUTH MOISTURIZING) SOLN Use as directed 1 spray in the mouth or throat 6 (six) times daily.    [provider]  atorvastatin (LIPITOR) 10 MG tablet Take 10 mg by mouth daily.     [provider]  buPROPion (WELLBUTRIN XL) 300 MG 24 hr tablet Take 300 mg by mouth daily. depression    [provider]  cloNIDine (CATAPRES - DOSED IN MG/24 HR) 0.1 mg/24hr patch Place 0.1 mg onto the skin every Sunday. hypertension    [provider]  dipyridamole-aspirin (AGGRENOX) 200-25 MG 12hr capsule Take 1 capsule by mouth 2 (two) times daily. Stroke prevention    [provider]  gabapentin (NEURONTIN) 400 MG capsule Take 400 mg by mouth at bedtime.     [provider]  hydrochlorothiazide (HYDRODIURIL) 25 MG tablet Take 25 mg by mouth daily. CHF    [provider]  insulin aspart (NOVOLOG FLEXPEN) 100 UNIT/ML FlexPen Inject 5 Units into the skin 3 (three) times daily with meals. Patient taking differently: Inject 5 Units into the skin 3 (three) times daily with meals. If her blood sugar is below 150 she  take 5 units 10/19/17   Kathi Ludwig, MD  Insulin Glargine (LANTUS SOLOSTAR) 100 UNIT/ML Solostar Pen Inject 15 Units into the skin daily at 10 pm. Patient taking differently: Inject 20 Units into the skin daily at 10 pm.  10/19/17   Kathi Ludwig, MD  Magnesium 250 MG TABS Take 250 mg by mouth daily.    [provider]  Melatonin 5 MG TABS Take 5 mg by mouth at bedtime.    [provider]  metFORMIN (GLUCOPHAGE-XR) 500 MG 24 hr tablet Take 500 mg by mouth daily with breakfast.    [provider]   metoprolol succinate (TOPROL-XL) 50 MG 24 hr tablet Take 50 mg by mouth daily. Take with or immediately following a meal.    [provider]  nitroGLYCERIN (NITROSTAT) 0.4 MG SL tablet Place 0.4 mg under the tongue every 5 (five) minutes as needed for chest pain.    [provider]  polyethylene glycol (MIRALAX / GLYCOLAX) packet Take 17 g by mouth daily. Mix in 8 oz liquid and drink     [provider]  potassium chloride SA (K-DUR,KLOR-CON) 20 MEQ tablet Take 20 mEq by mouth daily.     [provider]  rOPINIRole (REQUIP) 0.25 MG tablet Take 0.25 mg by mouth at bedtime.    [provider]  simethicone (MYLICON) 250 MG chewable tablet Chew 125 mg by mouth 3 (three) times daily as needed for flatulence (before meals as needed).    [provider]  Zinc Oxide (BALMEX EX) Apply 1 application topically 2 (two) times daily as needed (incontinence).    [provider]    Family History History reviewed. No pertinent family history.  Social History Social History   Tobacco Use  . Smoking status: Never Smoker  . Smokeless tobacco: Never Used  Substance Use Topics  . Alcohol use: Not Currently  . Drug use: Never     Allergies   Ciprofloxacin   Review of Systems Review of Systems  Constitutional: Negative for chills and fever.  Respiratory: Positive for shortness of breath. Negative for cough.   Cardiovascular: Negative for chest pain.  Gastrointestinal: Positive for abdominal pain. Negative for constipation, diarrhea, nausea and vomiting.  Genitourinary: Negative for dysuria and hematuria.  Skin: Positive for rash.  Neurological: Positive for weakness. Negative for syncope, numbness and headaches.  All other systems reviewed and are negative.    Physical Exam Updated Vital Signs BP 112/66   Pulse 83   Temp 97.8 F (36.6 C) (Oral)   Resp 19   SpO2 99%   Physical Exam Vitals signs and nursing note reviewed.   Constitutional:      General: She is not in acute distress.    Appearance: She is well-developed.  HENT:     Head: Normocephalic and atraumatic.  Eyes:     General:        Right eye: No discharge.        Left eye: No discharge.     Conjunctiva/sclera: Conjunctivae normal.     Pupils: Pupils are equal, round, and reactive to light.  Neck:     Musculoskeletal: Normal range of motion and neck supple.     Vascular: No JVD.     Trachea: No tracheal deviation.  Cardiovascular:     Rate and Rhythm: Normal rate and regular rhythm.     Pulses: Normal pulses.  Pulmonary:     Effort: Pulmonary effort is normal.     Breath sounds:  Normal breath sounds.  Abdominal:     General: Bowel sounds are normal. There is no distension.     Palpations: Abdomen is soft.     Tenderness: There is abdominal tenderness in the suprapubic area, left upper quadrant and left lower quadrant. There is no right CVA tenderness, left CVA tenderness, guarding or rebound.  Skin:    General: Skin is warm.     Findings: Rash present. No erythema.     Comments: See below image. Patient with rash to chest, abdomen, back, neck, and extremities. Excoriations noted. No bulls-eye lesion, nikolsky sign absent.  Neurological:     Mental Status: She is alert.     Comments: Oriented to person and place but not time.  Able to follow simple commands.  Right-sided hemiparesis noted which patient reports is chronic and unchanged.  Answers questions slowly but appropriately.  Station intact to soft touch of face and extremities.  Psychiatric:        Behavior: Behavior normal.        ED Treatments / Results  Labs (all labs ordered are listed, but only abnormal results are displayed) Labs Reviewed  LACTIC ACID, PLASMA - Abnormal; Notable for the following components:      Result Value   Lactic Acid, Venous 2.3 (*)    All other components within normal limits  LACTIC ACID, PLASMA - Abnormal; Notable for the following  components:   Lactic Acid, Venous 2.4 (*)    All other components within normal limits  COMPREHENSIVE METABOLIC PANEL - Abnormal; Notable for the following components:   Potassium 6.3 (*)    CO2 18 (*)    BUN 46 (*)    Creatinine, Ser 2.82 (*)    Albumin 3.3 (*)    GFR calc non Af Amer 17 (*)    GFR calc Af Amer 19 (*)    All other components within normal limits  CBC WITH DIFFERENTIAL/PLATELET - Abnormal; Notable for the following components:   Abs Immature Granulocytes 0.16 (*)    All other components within normal limits  MAGNESIUM - Abnormal; Notable for the following components:   Magnesium 2.9 (*)    All other components within normal limits  SARS CORONAVIRUS 2 (HOSPITAL ORDER, [place] [facility])  CULTURE, BLOOD (ROUTINE X 2)  CULTURE, BLOOD (ROUTINE X 2)  URINE CULTURE  LIPASE, BLOOD  TROPONIN I  URINALYSIS, ROUTINE W REFLEX MICROSCOPIC  BASIC METABOLIC PANEL  SODIUM, URINE, RANDOM  CREATININE, URINE, RANDOM  TROPONIN I  TROPONIN I  TROPONIN I  HIV ANTIBODY (ROUTINE TESTING W REFLEX)  BASIC METABOLIC PANEL  CBC  CBG MONITORING, ED  CBG MONITORING, ED    EKG EKG Interpretation  Date/Time:  Thursday Nov 18 2018 18:06:13 EDT Ventricular Rate:  83 PR Interval:    QRS Duration: 168 QT Interval:  423 QTC Calculation: 498 R Axis:   18 Text Interpretation:  Sinus rhythm Left bundle branch block , new Confirmed by Davonna Belling [phone]) on 11/18/2018 6:37:15 PM Also confirmed by Davonna Belling [phone])  on 11/18/2018 9:20:14 PM   Radiology Ct Head Wo Contrast  Result Date: 11/18/2018 CLINICAL DATA:  Lethargy and weakness beginning this afternoon. EXAM: CT HEAD WITHOUT CONTRAST TECHNIQUE: Contiguous axial images were obtained from the base of the skull through the vertex without intravenous contrast. COMPARISON:  None. FINDINGS: Brain: No evidence of acute infarction, hemorrhage, hydrocephalus, extra-axial collection or mass lesion/mass  effect. Chronic microvascular ischemic change noted. Vascular: Extensive atherosclerosis is  seen. Skull: Intact.  No focal lesion. Sinuses/Orbits: Status post cataract surgery on the left. Otherwise negative. Other: None. IMPRESSION: No acute abnormality. Chronic microvascular ischemic change. Extensive cortical atrophy. Electronically Signed   By: Inge Rise M.D.   On: 11/18/2018 19:58   Dg Chest Port 1 View  Result Date: 11/18/2018 CLINICAL DATA:  Altered mental status. EXAM: PORTABLE CHEST 1 VIEW COMPARISON:  09/01/2000 FINDINGS: Knee the heart is upper limits of normal in size and stable. Normal appearance of the mediastinal and hilar contours. Coronary artery stent suspected. The lungs are clear. No infiltrates or effusions. No worrisome pulmonary lesions. The bony thorax is intact. IMPRESSION: No acute cardiopulmonary findings. Electronically Signed   By: Marijo Sanes M.D.   On: 11/18/2018 19:37    Procedures Procedures (including critical care time)  Medications Ordered in ED Medications  atorvastatin (LIPITOR) tablet 10 mg (has no administration in time range)  cloNIDine (CATAPRES - Dosed in mg/24 hr) patch 0.1 mg (has no administration in time range)  metoprolol succinate (TOPROL-XL) 24 hr tablet 50 mg (has no administration in time range)  buPROPion (WELLBUTRIN XL) 24 hr tablet 300 mg (has no administration in time range)  polyethylene glycol (MIRALAX / GLYCOLAX) packet 17 g (has no administration in time range)  dipyridamole-aspirin (AGGRENOX) 200-25 MG per 12 hr capsule 1 capsule (has no administration in time range)  heparin injection 5,000 Units (has no administration in time range)  acetaminophen (TYLENOL) tablet 650 mg (has no administration in time range)    Or  acetaminophen (TYLENOL) suppository 650 mg (has no administration in time range)  insulin aspart (novoLOG) injection 0-9 Units (has no administration in time range)  sodium chloride 0.9 % bolus 500 mL (0 mLs  Intravenous Stopped 11/18/18 2309)  methylPREDNISolone sodium succinate (SOLU-MEDROL) 125 mg/2 mL injection 80 mg (80 mg Intravenous Given 11/18/18 2126)  calcium gluconate inj 10% (1 g) URGENT USE ONLY! (1 g Intravenous Given 11/18/18 2138)  insulin aspart (novoLOG) injection 10 Units (10 Units Intravenous Given 11/18/18 2148)    And  dextrose 50 % solution 50 mL (50 mLs Intravenous Given 11/18/18 2144)  albuterol (VENTOLIN HFA) 108 (90 Base) MCG/ACT inhaler 4 puff (4 puffs Inhalation Given 11/18/18 2139)  sodium polystyrene (KAYEXALATE) 15 GM/60ML suspension 15 g (15 g Oral Given 11/18/18 2310)     Initial Impression / Assessment and Plan / ED Course  I have reviewed the triage vital signs and the nursing notes.  Pertinent labs & imaging results that were available during my care of the patient were reviewed by me and considered in my medical decision making (see chart for details).       Patient presenting for evaluation of increased lethargy.  She had several complaints including a rash, abdominal pain, shortness of breath.  She is afebrile, vital signs stable.  She has right-sided deficits from her prior stroke which she reports are at baseline.  She is not hypoglycemic in the ED.  No new focal neurologic deficits on examination and head CT shows no acute intracranial abnormalities.  Her EKG does show a new left bundle branch block and her lab work reviewed by me is significant for hyperkalemia with a potassium of 6.3 and an AKI with creatinine of 2.82 and BUN 46.  Suspect that she is dehydrated, exacerbated by her HCTZ and potassium supplement.  She was given insulin and dextrose, calcium gluconate, IV fluids for hyperkalemia.  Also given small dose of Solu-Medrol for pruritic rash.  With  hyperkalemia and EKG changes she will require admission for further evaluation and management.  Internal medicine teaching service to admit. Final Clinical Impressions(s) / ED Diagnoses   Final diagnoses:  AKI  (acute kidney injury) [facility])  Abdominal pain  Rash    ED Discharge Orders    None       Debroah Baller 11/18/18 2324    Davonna Belling, MD 11/18/18 2359

## 2018-11-18 NOTE — ED Notes (Signed)
ED TO INPATIENT HANDOFF REPORT  ED Nurse Name and Phone #:  Patty [phone]  S Name/Age/Gender [name] [name] 68 y.o. female Room/Bed: 031C/031C  Code Status   Code Status: Full Code  Home/SNF/Other Home Patient oriented to: self, place, time and situation Is this baseline? Yes   Triage Complete: Triage complete  Chief Complaint lethargy hypoglycemia  Triage Note Pt comes from home via EMS. Around 4pm family noticed pt was lethargic. CBG 60, pt drank OJ, CBG improve to 80. Pt still lethargic. EMS arrived, CBG 90. Pt is able to answer questions/follow simple commands. But is weak all over. Pt has wound on right buttocks that [facility] attends to. Hx of stroke with some right sided weakness. No new symptoms today other than generalized weakness. Denies fevers/ exposure to Covid   Allergies Allergies  Allergen Reactions  . Ciprofloxacin Hives    Level of Care/Admitting Diagnosis ED Disposition    ED Disposition Condition [facility]: [place] [[id number]]  Level of Care: Telemetry Medical [104]  Covid Evaluation: Screening Protocol (No Symptoms)  Diagnosis: AKI (acute kidney injury) [facility]) [[id number]]  Admitting Physician: Axel Filler [phone]  Attending Physician: Axel Filler [[id number]]  Estimated length of stay: 3 - 4 days  Certification:: I certify this patient will need inpatient services for at least 2 midnights  PT Class (Do Not Modify): Inpatient [101]  PT Acc Code (Do Not Modify): Private [1]       B Medical/Surgery History Past Medical History:  Diagnosis Date  . Arthritis   . Diabetes mellitus without complication ([place])   . Stroke [facility])    History reviewed. No pertinent surgical history.   A IV Location/Drains/Wounds Patient Lines/Drains/Airways Status   Active Line/Drains/Airways    Name:   Placement date:   Placement time:   Site:   Days:   Peripheral IV 11/18/18 Left Hand   11/18/18    2013     Hand   less than 1   External Urinary Catheter   10/18/17    1640    -   396   External Urinary Catheter   11/18/18    1851    -   less than 1          Intake/Output Last 24 hours No intake or output data in the 24 hours ending 11/18/18 2244  Labs/Imaging Results for orders placed or performed during the hospital encounter of 11/18/18 (from the past 48 hour(s))  CBG monitoring, ED     Status: None   Collection Time: 11/18/18  6:05 PM  Result Value Ref Range   Glucose-Capillary 81 70 - 99 mg/dL   Comment 1 Notify RN    Comment 2 Document in Chart   Lactic acid, plasma     Status: Abnormal   Collection Time: 11/18/18  7:03 PM  Result Value Ref Range   Lactic Acid, Venous 2.3 (HH) 0.5 - 1.9 mmol/L    Comment: CRITICAL RESULT CALLED TO, READ BACK BY AND VERIFIED WITH: Camille Bal 1941 11/18/2018 D BRADLEY Performed at [facility], [place] [address], [place], [place] [address]   Comprehensive metabolic panel     Status: Abnormal   Collection Time: 11/18/18  7:03 PM  Result Value Ref Range   Sodium 137 135 - 145 mmol/L   Potassium 6.3 (HH) 3.5 - 5.1 mmol/L    Comment: CRITICAL RESULT CALLED TO, READ BACK BY AND VERIFIED WITH: [place]  11/18/2018 D BRADLEY    Chloride 107 98 - 111 mmol/L   CO2 18 (L) 22 - 32 mmol/L   Glucose, Bld 97 70 - 99 mg/dL   BUN 46 (H) 8 - 23 mg/dL   Creatinine, Ser 2.82 (H) 0.44 - 1.00 mg/dL   Calcium 9.1 8.9 - 10.3 mg/dL   Total Protein 7.0 6.5 - 8.1 g/dL   Albumin 3.3 (L) 3.5 - 5.0 g/dL   AST 16 15 - 41 U/L   ALT 11 0 - 44 U/L   Alkaline Phosphatase 66 38 - 126 U/L   Total Bilirubin 0.7 0.3 - 1.2 mg/dL   GFR calc non Af Amer 17 (L) >60 mL/min   GFR calc Af Amer 19 (L) >60 mL/min   Anion gap 12 5 - 15    Comment: Performed at [facility] [address], [place], [place] [address]  CBC WITH DIFFERENTIAL     Status: Abnormal   Collection Time: 11/18/18  7:03 PM  Result Value Ref Range   WBC 9.1 4.0 - 10.5 K/uL   RBC 4.16 3.87 -  5.11 MIL/uL   Hemoglobin 12.2 12.0 - 15.0 g/dL   HCT 39.2 36.0 - 46.0 %   MCV 94.2 80.0 - 100.0 fL   MCH 29.3 26.0 - 34.0 pg   MCHC 31.1 30.0 - 36.0 g/dL   RDW 14.7 11.5 - 15.5 %   Platelets 299 150 - 400 K/uL   nRBC 0.0 0.0 - 0.2 %   Neutrophils Relative % 79 %   Neutro Abs 7.2 1.7 - 7.7 K/uL   Lymphocytes Relative 10 %   Lymphs Abs 0.9 0.7 - 4.0 K/uL   Monocytes Relative 4 %   Monocytes Absolute 0.4 0.1 - 1.0 K/uL   Eosinophils Relative 5 %   Eosinophils Absolute 0.4 0.0 - 0.5 K/uL   Basophils Relative 0 %   Basophils Absolute 0.0 0.0 - 0.1 K/uL   Immature Granulocytes 2 %   Abs Immature Granulocytes 0.16 (H) 0.00 - 0.07 K/uL    Comment: Performed at [facility] [address], [place], [place] [address]  Lipase, blood     Status: None   Collection Time: 11/18/18  7:03 PM  Result Value Ref Range   Lipase 18 11 - 51 U/L    Comment: Performed at [facility] [address], [place], [place] [address]  Troponin I - ONCE - STAT     Status: None   Collection Time: 11/18/18  7:03 PM  Result Value Ref Range   Troponin I <0.03 <0.03 ng/mL    Comment: Performed at [facility], [place] [address], [place], [place] [address]  Magnesium     Status: Abnormal   Collection Time: 11/18/18  8:14 PM  Result Value Ref Range   Magnesium 2.9 (H) 1.7 - 2.4 mg/dL    Comment: Performed at [facility] [address], [place], [place] [address]  SARS Coronavirus 2 (CEPHEID - Performed in [place] hospital lab), Hosp Order     Status: None   Collection Time: 11/18/18  8:26 PM  Result Value Ref Range   SARS Coronavirus 2 NEGATIVE NEGATIVE    Comment: (NOTE) If result is NEGATIVE SARS-CoV-2 target nucleic acids are NOT DETECTED. The SARS-CoV-2 RNA is generally detectable in upper and lower  respiratory specimens during the acute phase of infection. The lowest  concentration of SARS-CoV-2 viral copies this assay can detect  is 250  copies / mL. A negative result does  not preclude SARS-CoV-2 infection  and should not be used as the sole basis for treatment or other  patient management decisions.  A negative result may occur with  improper specimen collection / handling, submission of specimen other  than nasopharyngeal swab, presence of viral mutation(s) within the  areas targeted by this assay, and inadequate number of viral copies  (<250 copies / mL). A negative result must be combined with clinical  observations, patient history, and epidemiological information. If result is POSITIVE SARS-CoV-2 target nucleic acids are DETECTED. The SARS-CoV-2 RNA is generally detectable in upper and lower  respiratory specimens dur ing the acute phase of infection.  Positive  results are indicative of active infection with SARS-CoV-2.  Clinical  correlation with patient history and other diagnostic information is  necessary to determine patient infection status.  Positive results do  not rule out bacterial infection or co-infection with other viruses. If result is PRESUMPTIVE POSTIVE SARS-CoV-2 nucleic acids MAY BE PRESENT.   A presumptive positive result was obtained on the submitted specimen  and confirmed on repeat testing.  While 2019 novel coronavirus  (SARS-CoV-2) nucleic acids may be present in the submitted sample  additional confirmatory testing may be necessary for epidemiological  and / or clinical management purposes  to differentiate between  SARS-CoV-2 and other Sarbecovirus currently known to infect humans.  If clinically indicated additional testing with [name] alternate test  methodology [id number]) is advised. The SARS-CoV-2 RNA is generally  detectable in upper and lower respiratory sp ecimens during the acute  phase of infection. The expected result is Negative. Fact Sheet for Patients:  StrictlyIdeas.no Fact Sheet for Healthcare Providers: BankingDealers.co.za This test is not yet approved or  cleared by the [place] FDA and has been authorized for detection and/or diagnosis of SARS-CoV-2 by FDA under [name] Emergency Use Authorization (EUA).  This EUA will remain in effect (meaning this test can be used) for the duration of the COVID-19 declaration under Section 564(b)(1) of the Act, 21 U.S.C. section 360bbb-3(b)(1), unless the authorization is terminated or revoked sooner. Performed at [facility], [place] [address], [place], [place] [address]   Lactic acid, plasma     Status: Abnormal   Collection Time: 11/18/18  9:12 PM  Result Value Ref Range   Lactic Acid, Venous 2.4 (HH) 0.5 - 1.9 mmol/L    Comment: CRITICAL RESULT CALLED TO, READ BACK BY AND VERIFIED WITH: P Berley Gambrell,RN 2155 11/18/2018 WBOND Performed at [facility], [place] [address] [place], [place] [address]    Ct Head Wo Contrast  Result Date: 11/18/2018 CLINICAL DATA:  Lethargy and weakness beginning this afternoon. EXAM: CT HEAD WITHOUT CONTRAST TECHNIQUE: Contiguous axial images were obtained from the base of the skull through the vertex without intravenous contrast. COMPARISON:  None. FINDINGS: Brain: No evidence of acute infarction, hemorrhage, hydrocephalus, extra-axial collection or mass lesion/mass effect. Chronic microvascular ischemic change noted. Vascular: Extensive atherosclerosis is seen. Skull: Intact.  No focal lesion. Sinuses/Orbits: Status post cataract surgery on the left. Otherwise negative. Other: None. IMPRESSION: No acute abnormality. Chronic microvascular ischemic change. Extensive cortical atrophy. Electronically Signed   By: Inge Rise M.D.   On: 11/18/2018 19:58   Dg Chest Port 1 View  Result Date: 11/18/2018 CLINICAL DATA:  Altered mental status. EXAM: PORTABLE CHEST 1 VIEW COMPARISON:  09/01/2000 FINDINGS: Knee the heart is upper limits of normal in size and stable. Normal appearance of  the mediastinal and hilar contours. Coronary artery stent suspected. The lungs are clear. No  infiltrates or effusions. No worrisome pulmonary lesions. The bony thorax is intact. IMPRESSION: No acute cardiopulmonary findings. Electronically Signed   By: Marijo Sanes M.D.   On: 11/18/2018 19:37    Pending Labs Unresulted Labs (From admission, onward)    Start     Ordered   11/19/18 3893  Basic metabolic panel  Tomorrow morning,   R     11/18/18 2230   11/19/18 0500  CBC  Tomorrow morning,   R     11/18/18 2230   11/18/18 2228  HIV antibody (Routine Testing)  Once,   R     11/18/18 2230   11/18/18 2141  Troponin I - Now Then Q6H  Now then every 6 hours,   R     11/18/18 2140   11/18/18 2140  Creatinine, urine, random  Once,   R     11/18/18 2139   11/18/18 2139  Sodium, urine, random  Once,   R     11/18/18 2139   11/18/18 7342  Basic metabolic panel  ONCE - STAT,   R     11/18/18 2132   11/18/18 1836  Blood Culture (routine x 2)  BLOOD CULTURE X 2,   STAT     11/18/18 1836   11/18/18 1836  Urinalysis, Routine w reflex microscopic  ONCE - STAT,   STAT     11/18/18 1836   11/18/18 1836  Urine culture  ONCE - STAT,   STAT     11/18/18 1836          Vitals/Pain Today's Vitals   11/18/18 1802 11/18/18 1805 11/18/18 1815 11/18/18 1830  BP:  107/66 104/76 112/66  Pulse:   83   Resp:  15 12 19   Temp:  97.8 F (36.6 C)    TempSrc:  Oral    SpO2:  97% 99%   PainSc: 0-No pain       Isolation Precautions No active isolations  Medications Medications  sodium polystyrene (KAYEXALATE) 15 GM/60ML suspension 15 g (has no administration in time range)  atorvastatin (LIPITOR) tablet 10 mg (has no administration in time range)  cloNIDine (CATAPRES - Dosed in mg/24 hr) patch 0.1 mg (has no administration in time range)  metoprolol succinate (TOPROL-XL) 24 hr tablet 50 mg (has no administration in time range)  buPROPion (WELLBUTRIN XL) 24 hr tablet 300 mg (has no administration in time range)  polyethylene glycol (MIRALAX / GLYCOLAX) packet 17 g (has no administration in time  range)  dipyridamole-aspirin (AGGRENOX) 200-25 MG per 12 hr capsule 1 capsule (has no administration in time range)  heparin injection 5,000 Units (has no administration in time range)  acetaminophen (TYLENOL) tablet 650 mg (has no administration in time range)    Or  acetaminophen (TYLENOL) suppository 650 mg (has no administration in time range)  insulin aspart (novoLOG) injection 0-9 Units (has no administration in time range)  sodium chloride 0.9 % bolus 500 mL (500 mLs Intravenous New Bag/Given 11/18/18 2036)  methylPREDNISolone sodium succinate (SOLU-MEDROL) 125 mg/2 mL injection 80 mg (80 mg Intravenous Given 11/18/18 2126)  calcium gluconate inj 10% (1 g) URGENT USE ONLY! (1 g Intravenous Given 11/18/18 2138)  insulin aspart (novoLOG) injection 10 Units (10 Units Intravenous Given 11/18/18 2148)    And  dextrose 50 % solution 50 mL (50 mLs Intravenous Given 11/18/18 2144)  albuterol (VENTOLIN HFA) 108 (90 Base) MCG/ACT inhaler  4 puff (4 puffs Inhalation Given 11/18/18 2139)    Mobility walks with device High fall risk   Focused Assessments Cardiac Assessment Handoff:  Cardiac Rhythm: Normal sinus rhythm Lab Results  Component Value Date   TROPONINI <0.03 11/18/2018   No results found for: DDIMER Does the Patient currently have chest pain? No     R Recommendations: See Admitting Provider Note  Report given to:   Additional Notes:

## 2018-11-18 NOTE — ED Notes (Signed)
If needed, medication list or history, nancy fox rn at [facility] where pt is seen, [phone]

## 2018-11-18 NOTE — ED Triage Notes (Signed)
Pt comes from home via EMS. Around 4pm family noticed pt was lethargic. CBG 60, pt drank OJ, CBG improve to 80. Pt still lethargic. EMS arrived, CBG 90. Pt is able to answer questions/follow simple commands. But is weak all over. Pt has wound on right buttocks that [facility] attends to. Hx of stroke with some right sided weakness. No new symptoms today other than generalized weakness. Denies fevers/ exposure to Covid

## 2018-11-18 NOTE — ED Notes (Signed)
Nurse navigator spoke with patient asked if she wanted to talk with family. Patient did not want to update family now and to update daughter with decision of admit or discharge is made.

## 2018-11-18 NOTE — H&P (Addendum)
Date: 11/18/2018               Patient Name:  [name] [name] MRN: [id number]  DOB: 12-Apr-1951 Age / Sex: 68 y.o., female   PCP: Janifer Adie, MD         Medical Service: Internal Medicine Teaching Service         Attending Physician: Dr. Evette Doffing, Mallie Mussel, *    First Contact: Dr. Myrtie Hawk Pager: [phone]  Second Contact: Dr. Shan Levans Pager: [phone]       After Hours (After 5p/  First Contact Pager: [phone]  weekends / holidays): Second Contact Pager: [phone]   Chief Complaint: Altered Mental Status  History of Present Illness: [name] [name] is a 68 year old female with T2DM, history of CVA with residual right-sided deficits, HTN, HLD who presents with altered mental status.   History is obtained from the patient as well has her daughter and [name] [name].  Per her daughter, [name] [name] was doing well this morning.  She did not have much of an appetite and did not want to eat breakfast.  She ate a couple crackers throughout the day.  Around 4 PM her daughter helped her onto the toilet. She did not have any acute complaints.She then noticed after several minutes that her mother had bent over and she initially believed she was trying to pick something up.  When her mother stayed bent over she became very concerned.  Her daughter called her sister who [name] [name] was recently living with.  Her sister stated that this has happened in the past and to check her blood sugar. At the time she had a CBG in the 60's and they called EMS.   EMS was called who also found her to be hypoglycemic at 10.  Patient was given orange juice and CBG improved to 80.  She was however still lethargic at this time.  CBG on arrival to the ED was 81. [name] [name] last remembers being on the toilet but does not recall what happened afterwards. She states that she feels much better now but cannot describe how she was feeling earlier.   She uses 25 units Lantus nightly (last dose last night). Does not take  meal time insulin. Chronic symptoms include constipation, dull LLQ abdominal pain, intermittent SOB, LE edema, orthopnea (although this is unclear as she is unable to tell me why she cannot sleep lying flat--does not know whether she is more SOB). She has also had intermittent congestion but no cough. She denies dysuria, hematuria, diarrhea, fevers, chills, palpitations, chest pain. No sick contacts. Lives with her daughter only and has not left the house recently.   Per her daughter she has not had any new acute complaints other than worsening rash. Three weeks ago she developed a "nickle-sized sore" on the bottom of her buttocks. She was seen at [facility] who prescribed a barrier cream. This was applied for a week before they noticed that she developed erythema, swelling, and pruritus. Unfortunately they did not notice the rash before because of the amount they were putting on her bottom obscured the developing rash. [facility] recommended that they stop the barrier cream and start start Eucerin and Nystatin. She applied this for a week but does not believe that it has helped. She did not notice any pre-existing vesicles but does note "bumps and scratch marks".   In the ED she is found to have normal vital signs.  Mag 2.9.  Lactic acidosis of 2.3.  CMP showed a hyperkalemia of 6.3.  She also has an elevated BUN of 46 and creatinine of 2.82 (baseline appears to be between 1.1-1.4).  Non-anion gap metabolic acidosis. CBC unremarkable.  Troponin <0.03.  Lipase WNL.  Head CT showed chronic microvascular ischemic changes but no acute abnormalities.  CXR unremarkable.  She was given IV Solu-Medrol and 500 mils IV fluid bolus.  Albuterol, calcium gluconate, and insulin was ordered for her hyperkalemia.  Meds: No current facility-administered medications on file prior to encounter.    Current Outpatient Medications on File Prior to Encounter  Medication Sig  . acetaminophen (TYLENOL) 500 MG tablet Take 500 mg by mouth at  bedtime.  Marland Kitchen amLODipine (NORVASC) 10 MG tablet Take 10 mg by mouth daily. hypertension  . Artificial Saliva (BIOTENE DRY MOUTH MOISTURIZING) SOLN Use as directed 1 spray in the mouth or throat 6 (six) times daily.  Marland Kitchen atorvastatin (LIPITOR) 10 MG tablet Take 10 mg by mouth daily.   Marland Kitchen buPROPion (WELLBUTRIN XL) 300 MG 24 hr tablet Take 300 mg by mouth daily. depression  . cloNIDine (CATAPRES - DOSED IN MG/24 HR) 0.1 mg/24hr patch Place 0.1 mg onto the skin every Sunday. hypertension  . dipyridamole-aspirin (AGGRENOX) 200-25 MG 12hr capsule Take 1 capsule by mouth 2 (two) times daily. Stroke prevention  . gabapentin (NEURONTIN) 400 MG capsule Take 400 mg by mouth at bedtime.   . hydrochlorothiazide (HYDRODIURIL) 25 MG tablet Take 25 mg by mouth daily. CHF  . insulin aspart (NOVOLOG FLEXPEN) 100 UNIT/ML FlexPen Inject 5 Units into the skin 3 (three) times daily with meals. (Patient taking differently: Inject 5 Units into the skin 3 (three) times daily with meals. If her blood sugar is below 150 she take 5 units)  . Insulin Glargine (LANTUS SOLOSTAR) 100 UNIT/ML Solostar Pen Inject 15 Units into the skin daily at 10 pm. (Patient taking differently: Inject 20 Units into the skin daily at 10 pm. )  . Magnesium 250 MG TABS Take 250 mg by mouth daily.  . Melatonin 5 MG TABS Take 5 mg by mouth at bedtime.  . metFORMIN (GLUCOPHAGE-XR) 500 MG 24 hr tablet Take 500 mg by mouth daily with breakfast.  . metoprolol succinate (TOPROL-XL) 50 MG 24 hr tablet Take 50 mg by mouth daily. Take with or immediately following a meal.  . nitroGLYCERIN (NITROSTAT) 0.4 MG SL tablet Place 0.4 mg under the tongue every 5 (five) minutes as needed for chest pain.  . polyethylene glycol (MIRALAX / GLYCOLAX) packet Take 17 g by mouth daily. Mix in 8 oz liquid and drink   . potassium chloride SA (K-DUR,KLOR-CON) 20 MEQ tablet Take 20 mEq by mouth daily.   Marland Kitchen rOPINIRole (REQUIP) 0.25 MG tablet Take 0.25 mg by mouth at bedtime.  .  simethicone (MYLICON) 024 MG chewable tablet Chew 125 mg by mouth 3 (three) times daily as needed for flatulence (before meals as needed).  . Zinc Oxide (BALMEX EX) Apply 1 application topically 2 (two) times daily as needed (incontinence).    Allergies: Allergies as of 11/18/2018 - Review Complete 11/18/2018  Allergen Reaction Noted  . Ciprofloxacin Hives 10/18/2017   Past Medical History:  Diagnosis Date  . Arthritis   . Diabetes mellitus without complication ([place])   . Stroke [facility])     Family History: No significant past medical history.   Social History: She lives with her daughter. She is a current [facility] patient. She is retired. She denies alcohol, tobacco, or drug use.  Review of Systems: A complete ROS was negative except as per HPI.   Physical Exam: Blood pressure 112/66, pulse 83, temperature 97.8 F (36.6 C), temperature source Oral, resp. rate 19, SpO2 99 %. General: Lying in bed in no acute distress Neuro: Oriented to person, place, and time.  She is slow to answer questions but does seem to answer appropriately. HEENT: Normocephalic and atraumatic, EOMI Cardiovascular: Normal rate, regular rhythm Respiratory: Clear to auscultation bilaterally, normal work of breathing, normal oxygen saturations on room air Abdomen: Mild tenderness to palpation of LLQ pain. Soft. No masses appreciated.  MSK: No lower extremity edema, erythema, or warmth noted bilaterally Psych: Normal behavior, mood, and affect. Skin: Multiple areas of erythema, excoriations with overlying wheals, on buttocks, right-side of chest, and LE bilaterally.      EKG: personally reviewed my interpretation is sinus rhythm with new left bundle branch block.  CXR: personally reviewed my interpretation is clear lungs, normal heart size, no bony abnormalities.  Head CT: IMPRESSION: No acute abnormality. Chronic microvascular ischemic change. Extensive cortical atrophy.  Assessment & Plan by Problem:  Active Problems:   AKI (acute kidney injury) ([place])  [name] [name] is a 68 year old female with T2DM, HTN, HLD who presents with acute metabolic encephalopathy likely due to a combination of hypoglycemia and acute renal failure with hyperkalemia. Her ARF is most likely 2/2 poor PO intake in the setting of continued HCTZ use. Additionally her poor PO intake along with continued insulin use has likely contributed to her hypoglycemia.   Acute Metabolic Encephalopathy: She does appear to be close to baseline--alert and oriented with delayed responses.  - S/p 500 mL NS. Start LR at 100 mL/hr for 10 hours. - Holding sedating meds (gabapetin and ropinirole) - Monitoring CBGs - Treating hyperkalemia and ARF as below  T2DM with hypoglycemia:  - Hold home Lantus. She was however treated with 10 units Lantus for her hyperkalemia as below. - q4h CBGs with D50 PRN.  Hyperkalemia: K 6.3 on admission with EKG changes of widened QRS and peaked T waves.  - Given calcium gluconate, insulin, albuterol, and kayexalate.  -  Repeat EKG now  Acute on Chronic Kidney Disease: Creatine up to 2.8 from baseline of 1.2-1.3. Has not been drinking much the last couple days but continuing to take her HCTZ. Giving fluids.  - Checking abdominal US and UA.   LLQ Pain: Likely due to constipation.  - Ordered complete abdominal US to rule out acute intra-abdominal abnormality.   - Miralax daily  Contact Dermatitis: Skin rash is likely 2/2 barrier cream use. Does not appear to be consistent with shingles or super-imposed bacterial infection.  - Continue to monitor  FEN/GI: Carb-modified, IVFs DVT PPX: Heparin CODE Status: Full  Dispo: Admit patient to Inpatient with expected length of stay greater than 2 midnights.  Signed: Carroll Sage, MD 11/18/2018, 9:27 PM  Pager: [phone]

## 2018-11-19 DIAGNOSIS — G9341 Metabolic encephalopathy: Secondary | ICD-10-CM

## 2018-11-19 DIAGNOSIS — Z881 Allergy status to other antibiotic agents status: Secondary | ICD-10-CM

## 2018-11-19 DIAGNOSIS — Z794 Long term (current) use of insulin: Secondary | ICD-10-CM

## 2018-11-19 DIAGNOSIS — I447 Left bundle-branch block, unspecified: Secondary | ICD-10-CM

## 2018-11-19 DIAGNOSIS — I69351 Hemiplegia and hemiparesis following cerebral infarction affecting right dominant side: Secondary | ICD-10-CM

## 2018-11-19 DIAGNOSIS — Z79899 Other long term (current) drug therapy: Secondary | ICD-10-CM

## 2018-11-19 DIAGNOSIS — L8990 Pressure ulcer of unspecified site, unspecified stage: Secondary | ICD-10-CM | POA: Diagnosis present

## 2018-11-19 DIAGNOSIS — R1032 Left lower quadrant pain: Secondary | ICD-10-CM

## 2018-11-19 DIAGNOSIS — E875 Hyperkalemia: Secondary | ICD-10-CM

## 2018-11-19 DIAGNOSIS — E785 Hyperlipidemia, unspecified: Secondary | ICD-10-CM

## 2018-11-19 DIAGNOSIS — E11649 Type 2 diabetes mellitus with hypoglycemia without coma: Secondary | ICD-10-CM

## 2018-11-19 DIAGNOSIS — I10 Essential (primary) hypertension: Secondary | ICD-10-CM

## 2018-11-19 DIAGNOSIS — L259 Unspecified contact dermatitis, unspecified cause: Secondary | ICD-10-CM

## 2018-11-19 DIAGNOSIS — N179 Acute kidney failure, unspecified: Principal | ICD-10-CM

## 2018-11-19 DIAGNOSIS — R55 Syncope and collapse: Secondary | ICD-10-CM | POA: Diagnosis present

## 2018-11-19 LAB — BASIC METABOLIC PANEL
Anion gap: 15 (ref 5–15)
Anion gap: 15 (ref 5–15)
Anion gap: 10 (ref 5–15)
BUN: 47 mg/dL — ABNORMAL HIGH (ref 8–23)
BUN: 48 mg/dL — ABNORMAL HIGH (ref 8–23)
BUN: 38 mg/dL — ABNORMAL HIGH (ref 8–23)
CO2: 18 mmol/L — ABNORMAL LOW (ref 22–32)
CO2: 15 mmol/L — ABNORMAL LOW (ref 22–32)
CO2: 22 mmol/L (ref 22–32)
Calcium: 9.4 mg/dL (ref 8.9–10.3)
Calcium: 9.1 mg/dL (ref 8.9–10.3)
Calcium: 9 mg/dL (ref 8.9–10.3)
Chloride: 107 mmol/L (ref 98–111)
Chloride: 107 mmol/L (ref 98–111)
Chloride: 106 mmol/L (ref 98–111)
Creatinine, Ser: 2.52 mg/dL — ABNORMAL HIGH (ref 0.44–1.00)
Creatinine, Ser: 2.57 mg/dL — ABNORMAL HIGH (ref 0.44–1.00)
Creatinine, Ser: 1.94 mg/dL — ABNORMAL HIGH (ref 0.44–1.00)
GFR calc Af Amer: 22 mL/min — ABNORMAL LOW (ref >60)
GFR calc Af Amer: 22 mL/min — ABNORMAL LOW (ref >60)
GFR calc Af Amer: 30 mL/min — ABNORMAL LOW (ref >60)
GFR calc non Af Amer: 19 mL/min — ABNORMAL LOW (ref >60)
GFR calc non Af Amer: 19 mL/min — ABNORMAL LOW (ref >60)
GFR calc non Af Amer: 26 mL/min — ABNORMAL LOW (ref >60)
Glucose, Bld: 118 mg/dL — ABNORMAL HIGH (ref 70–99)
Glucose, Bld: 181 mg/dL — ABNORMAL HIGH (ref 70–99)
Glucose, Bld: 150 mg/dL — ABNORMAL HIGH (ref 70–99)
Potassium: 5.1 mmol/L (ref 3.5–5.1)
Potassium: 5.7 mmol/L — ABNORMAL HIGH (ref 3.5–5.1)
Potassium: 4.6 mmol/L (ref 3.5–5.1)
Sodium: 140 mmol/L (ref 135–145)
Sodium: 137 mmol/L (ref 135–145)
Sodium: 138 mmol/L (ref 135–145)

## 2018-11-19 LAB — CBC
HCT: 35.8 % — ABNORMAL LOW (ref 36.0–46.0)
Hemoglobin: 11.4 g/dL — ABNORMAL LOW (ref 12.0–15.0)
MCH: 29.6 pg (ref 26.0–34.0)
MCHC: 31.8 g/dL (ref 30.0–36.0)
MCV: 93 fL (ref 80.0–100.0)
Platelets: 281 10*3/uL (ref 150–400)
RBC: 3.85 MIL/uL — ABNORMAL LOW (ref 3.87–5.11)
RDW: 14.8 % (ref 11.5–15.5)
WBC: 7.4 10*3/uL (ref 4.0–10.5)
nRBC: 0 % (ref 0.0–0.2)

## 2018-11-19 LAB — CREATININE, URINE, RANDOM: Creatinine, Urine: 207.4 mg/dL

## 2018-11-19 LAB — TROPONIN I: Troponin I: <0.03 ng/mL (ref <0.03)

## 2018-11-19 LAB — GLUCOSE, CAPILLARY
Glucose-Capillary: 123 mg/dL — ABNORMAL HIGH (ref 70–99)
Glucose-Capillary: 146 mg/dL — ABNORMAL HIGH (ref 70–99)
Glucose-Capillary: 187 mg/dL — ABNORMAL HIGH (ref 70–99)
Glucose-Capillary: 253 mg/dL — ABNORMAL HIGH (ref 70–99)
Glucose-Capillary: 310 mg/dL — ABNORMAL HIGH (ref 70–99)
Glucose-Capillary: 58 mg/dL — ABNORMAL LOW (ref 70–99)

## 2018-11-19 LAB — URINALYSIS, ROUTINE W REFLEX MICROSCOPIC
Bilirubin Urine: NEGATIVE
Glucose, UA: NEGATIVE mg/dL
Hgb urine dipstick: NEGATIVE
Ketones, ur: NEGATIVE mg/dL
Nitrite: NEGATIVE
Protein, ur: NEGATIVE mg/dL
Specific Gravity, Urine: 1.023 (ref 1.005–1.030)
pH: 5 (ref 5.0–8.0)

## 2018-11-19 LAB — SODIUM, URINE, RANDOM: Sodium, Ur: <10 mmol/L

## 2018-11-19 LAB — HIV ANTIBODY (ROUTINE TESTING W REFLEX): HIV Screen 4th Generation wRfx: NONREACTIVE

## 2018-11-19 LAB — CK: Total CK: 45 U/L (ref 38–234)

## 2018-11-19 MED ORDER — INSULIN GLARGINE 100 UNIT/ML ~~LOC~~ SOLN
7.0000 [IU] | Freq: Every day | SUBCUTANEOUS | Status: DC
  Administered 2018-11-19: 7 [IU] via SUBCUTANEOUS
  Filled 2018-11-19 (×2): qty 0.07

## 2018-11-19 MED ORDER — METOPROLOL SUCCINATE ER 50 MG PO TB24
50.0000 mg | ORAL_TABLET | Freq: Every day | ORAL | Status: DC
  Administered 2018-11-19: 50 mg via ORAL
  Filled 2018-11-19: qty 1

## 2018-11-19 MED ORDER — LACTATED RINGERS IV SOLN
INTRAVENOUS | Status: DC
  Administered 2018-11-19 – 2018-11-20 (×2): via INTRAVENOUS

## 2018-11-19 MED ORDER — LACTATED RINGERS IV BOLUS
1000.0000 mL | Freq: Once | INTRAVENOUS | Status: AC
  Administered 2018-11-19: 1000 mL via INTRAVENOUS

## 2018-11-19 MED ORDER — INSULIN ASPART 100 UNIT/ML ~~LOC~~ SOLN
0.0000 [IU] | Freq: Every day | SUBCUTANEOUS | Status: DC
  Administered 2018-11-19: 2 [IU] via SUBCUTANEOUS

## 2018-11-19 MED ORDER — DEXTROSE 50 % IV SOLN
INTRAVENOUS | Status: AC
  Filled 2018-11-19: qty 50

## 2018-11-19 MED ORDER — DEXTROSE 50 % IV SOLN
12.5000 g | INTRAVENOUS | Status: AC
  Administered 2018-11-19: 12.5 g via INTRAVENOUS

## 2018-11-19 MED ORDER — INSULIN ASPART 100 UNIT/ML ~~LOC~~ SOLN: 0.0000 [IU] | Freq: Three times a day (TID) | SUBCUTANEOUS | Status: DC

## 2018-11-19 MED ORDER — LACTATED RINGERS IV SOLN
INTRAVENOUS | Status: AC
  Administered 2018-11-19: 02:00:00 via INTRAVENOUS

## 2018-11-19 MED ORDER — INSULIN ASPART 100 UNIT/ML ~~LOC~~ SOLN
2.0000 [IU] | Freq: Three times a day (TID) | SUBCUTANEOUS | Status: DC
  Administered 2018-11-19 – 2018-11-20 (×2): 2 [IU] via SUBCUTANEOUS

## 2018-11-19 MED ORDER — DIPHENHYDRAMINE-ZINC ACETATE 2-0.1 % EX CREA
TOPICAL_CREAM | Freq: Two times a day (BID) | CUTANEOUS | Status: DC | PRN
  Administered 2018-11-19 – 2018-11-20 (×2): via TOPICAL
  Filled 2018-11-19: qty 28

## 2018-11-19 MED ORDER — METOPROLOL SUCCINATE ER 50 MG PO TB24: 50.0000 mg | ORAL_TABLET | Freq: Every day | ORAL | Status: DC

## 2018-11-19 NOTE — Progress Notes (Signed)
New Admission Note:  Arrival Method: Via stretcher from ED Mental Orientation: Alert & Oriented x3 Telemetry: CCMD verified  Assessment: Completed Skin: Refer to flowsheet IV: Left Hand  Pain: 0/10 Safety Measures: Safety Fall Prevention Plan discussed with patient. Admission: Completed [facility] [place] Orientation: Patient has been orientated to the room, unit and the staff.  Orders have been reviewed and are being implemented. Will continue to monitor the patient. Call light has been placed within reach and bed alarm has been activated.   Vassie Moselle, RN  Phone Number: [phone]

## 2018-11-19 NOTE — Progress Notes (Signed)
CBG 179 

## 2018-11-19 NOTE — Progress Notes (Signed)
Spoke with Dr. Alfonse Spruce and answered questions. Fran Lowes, RN VAST

## 2018-11-19 NOTE — Progress Notes (Signed)
CBG-58-On call provider Alfonse Spruce, made aware. 12.5g of Dextrose 50 and a sanwich and applesauce given.  Recheck CBG-123.  Call bell placed in reach. Will continue to monitor.

## 2018-11-19 NOTE — Progress Notes (Signed)
Subjective: [name] was seen and evaluated. She denies any chest pain, dizziness. No dysuria or abdominal pain. She knows she is in hospital now but  she does not remember what happened at home.  Objective:  Vital signs in last 24 hours: Vitals:   11/18/18 1815 11/18/18 1830 11/19/18 0006 11/19/18 0430  BP: 104/76 112/66 107/64 (!) 95/50  Pulse: 83  77 74  Resp: 12 19 18 17   Temp:   97.7 F (36.5 C) 97.8 F (36.6 C)  TempSrc:   Oral Oral  SpO2: 99%  98% 97%  Weight:   101.1 kg   Height:   5\' 7"  (1.702 m)    Ph/e: General: appears comfortable, in no cute distress, slow to respond to questions (baseline) CV: RRR, nl S1S2, no JVD Abdomen: Soft, non tender to pulpation, BS present Extremities: no LEE Neurology exam: Alert and oriented x 3, mild weakness at right upper extremity (chronic)  Assessment/Plan:  Active Problems:   AKI (acute kidney injury) ([place])   Pressure injury of skin  68 year old female living with uncontrolled diabetes brought to the emergency department by family member after an episode of transient loss of consciousness, then found to have acute on chronic renal failure.  She was hypoglycemic on arrival, found to have acute on chronic renal failure. Transient loss of consciousness: Brief episode occurring at home while she was on the toilet.  Differential includes situational syncope versus hypoglycemia.  Blood sugar at the time was 60 and she seemed to have resolved with glucose administration.  Patient is still slow to respond, does not remember much about the event.  We need to talk with her family to see if this is close to her baseline mental status.   Acute on chronic renal failure: On exam her volume assessment is equivalent.  Urine sodium undetectable, most consistent with prerenal dehydration.  Renal ultrasound with chronic medical renal disease, no reduction or hydronephrosis.  No signs of urinary tract infection on urinalysis.  Hyaline casts  present, also consistent with dehydration.  Not much improvement in renal function based on labs with treatment so far, however it appears she only received a small amount of IV fluids before those labs were redrawn.  I agree with continuing IV fluids today, recheck blood work later this afternoon.  Acute Metabolic Encephalopathy: Improved. She is slow to respond but seems like this is her baseline. Likely due to AKI on CKD and hypoglycemia. No significant improvement in kidney function, how ever, she only received small amount of fluid at the time. Will continue IV fluid. Hypoglycemia resolved. Per family report, she had chronic constipation. Vasovagal  - 1 li Bolus LR then Continue LR at 100 mL/hr for 10 hours. - Holding sedating meds (gabapetin and ropinirole) - Monitoring CBGs - BMP this evening and daily  T2DM with hypoglycemia: Hypoglycemia resolved  - Sensitive SSI. Ma resume home Lantus later if BG elevated - q4h CBGs -BMP daily   Hyperkalemia: K 6.3 on admission with EKG changes of widened QRS and peaked T waves. Received calcium gluconate, insulin, albuterol, and kayexalate. K improved.  Repeated EKG normalized  -BMP this evening and daily  Acute on Chronic Kidney Disease: Creatine up to 2.8 from baseline of 1.2-1.3. Likely worsened in setting of dehydration. Further work up including [place] and UA performed. US showed chronic medical renal disease and simple cyst. UA with rare bacteria and WBC and hyalin cast only. Otherwise normal  -Giving fluids and will check BMP.  LLQ Pain and constipation: Abdominal exam is benign. BS present and no tenderness. Abdominal US without acute intra-abdominal abnormality.   - continue Miralax daily  Contact Dermatitis: Skin rash is likely 2/2 barrier cream use. - Topical benadryl  FEN/GI: Carb-modified, IVFs DVT PPX: Heparin CODE Status: Full  Dispo: Anticipated discharge in approximately 2-3 days  Dewayne Hatch, MD  11/19/2018, 6:37 AM Pager: [phone]

## 2018-11-19 NOTE — Progress Notes (Signed)
Spoke with lab tech,according to her,patient has been stuck already three times and unable. Per their policy,they can only stick pts 3x/in 24 hrs. MD on call made aware. Adryel Wortmann, Wonda Cheng, Therapist, sports

## 2018-11-19 NOTE — Progress Notes (Signed)
I talked to [name] daughter, [name] [name], she mentions that her mother does not have dementia. And is not slow in response. (She has not talked to her today to see if she sounds like her baseline or not). [name] is wheelchair bound. [name] [name] endorses that her mother did not lose her consciousness during the hypoglycemic episode yesterday.

## 2018-11-19 NOTE — Progress Notes (Signed)
CSW received a phone call from Lelon Frohlich, Education officer, museum with [facility]. She stated that the patient is active with [facility] and she could be of assistance with any discharge planning. Lelon Frohlich can be reached at [phone]. If the patient has any needs this weekend please call [phone] and press option 1 for the on-call nurse.   CSW will continue to follow.   Domenic Schwab, MSW, [place]

## 2018-11-19 NOTE — Progress Notes (Signed)
Inpatient Diabetes Program Recommendations  AACE/ADA: New Consensus Statement on Inpatient Glycemic Control (2015)  Target Ranges:  Prepandial:   less than 140 mg/dL      Peak postprandial:   less than 180 mg/dL (1-2 hours)      Critically ill patients:  140 - 180 mg/dL   Lab Results  Component Value Date   GLUCAP 146 (H) 11/19/2018   HGBA1C 13.3 (H) 10/18/2017    Review of Glycemic Control Results for KAMALA, KOLTON (MRN [id number]) as of 11/19/2018 11:50  Ref. Range 11/19/2018 08:18 11/19/2018 10:09 11/19/2018 11:47  Glucose-Capillary Latest Ref Range: 70 - 99 mg/dL 310 (H) 187 (H) 146 (H)   Diabetes history: Type 2 DM Outpatient Diabetes medications: Lantus 20 units WHS, Novolog 5 units TID, Metformin 500 mg QAM Current orders for Inpatient glycemic control: Novolog 0-9 units Q4H  Novolog 10 units x1, Solumedrol 80 mg x 1  Inpatient Diabetes Program Recommendations:    Noted patient experienced hypoglycemia of 58 mg/dL following novolog 10 units x1.   Recommend in setting of steroids and renal status consider:   - Novolog 0-9 units TID & HS - Lantus 10 units QHS - Last A1C was 13.3% in 2019, consider repeating A1C to determine glycemic control.  Thanks, Bronson Curb, MSN, RNC-OB Diabetes Coordinator [phone] (8a-5p)

## 2018-11-19 NOTE — Consult Note (Addendum)
[facility] Nurse wound consult note Reason for Consult: Consult requested for buttocks, Pt is currently incontinent of urine and loose stools and has generalized moisture associated skin damage to bilat buttocks;and red moist patchy areas of full thickness skin loss scattered throughout.  These are NOT pressure injuries. Abd and breast skin folds have partial thickness fissures which are red and moist; appearance consistent with intertrigo related to moisture Dressing procedure/placement/frequency: Antifungal powder to skin folds to promote healing.  Barrier cream to buttocks to repel moisture and protect skin, antifungal powder to promote healing. Please re-consult if further assistance is needed.  Thank-you,  Julien Girt MSN, [place], [place], [place], [place]

## 2018-11-20 DIAGNOSIS — Z91013 Allergy to seafood: Secondary | ICD-10-CM

## 2018-11-20 DIAGNOSIS — I2510 Atherosclerotic heart disease of native coronary artery without angina pectoris: Secondary | ICD-10-CM

## 2018-11-20 DIAGNOSIS — Z888 Allergy status to other drugs, medicaments and biological substances status: Secondary | ICD-10-CM

## 2018-11-20 DIAGNOSIS — L8990 Pressure ulcer of unspecified site, unspecified stage: Secondary | ICD-10-CM

## 2018-11-20 DIAGNOSIS — K5900 Constipation, unspecified: Secondary | ICD-10-CM

## 2018-11-20 LAB — URINE CULTURE: Culture: >=100000 — AB

## 2018-11-20 LAB — BASIC METABOLIC PANEL
Anion gap: 10 (ref 5–15)
BUN: 34 mg/dL — ABNORMAL HIGH (ref 8–23)
CO2: 23 mmol/L (ref 22–32)
Calcium: 8.8 mg/dL — ABNORMAL LOW (ref 8.9–10.3)
Chloride: 104 mmol/L (ref 98–111)
Creatinine, Ser: 1.62 mg/dL — ABNORMAL HIGH (ref 0.44–1.00)
GFR calc Af Amer: 38 mL/min — ABNORMAL LOW (ref >60)
GFR calc non Af Amer: 33 mL/min — ABNORMAL LOW (ref >60)
Glucose, Bld: 85 mg/dL (ref 70–99)
Potassium: 4.4 mmol/L (ref 3.5–5.1)
Sodium: 137 mmol/L (ref 135–145)

## 2018-11-20 LAB — GLUCOSE, CAPILLARY
Glucose-Capillary: 95 mg/dL (ref 70–99)
Glucose-Capillary: 97 mg/dL (ref 70–99)
Glucose-Capillary: 84 mg/dL (ref 70–99)
Glucose-Capillary: 148 mg/dL — ABNORMAL HIGH (ref 70–99)

## 2018-11-20 MED ORDER — SODIUM CHLORIDE 0.9% FLUSH: 10.0000 mL | INTRAVENOUS | Status: DC | PRN

## 2018-11-20 MED ORDER — SODIUM CHLORIDE 0.9% FLUSH: 10.0000 mL | Freq: Two times a day (BID) | INTRAVENOUS | Status: DC

## 2018-11-20 NOTE — Progress Notes (Signed)
Subjective: Patient is doing well. Is more engaged in conversation today and answers questions properly. No complaint. She mentions that she ate ham for dinner last night. She endorses that she talked to her daughter over the phone. No acute event over night.  Objective:  Vital signs in last 24 hours: Vitals:   11/19/18 0903 11/19/18 1850 11/19/18 2000 11/20/18 0504  BP: 134/68 115/68 133/80 117/64  Pulse: 82 90 94 86  Resp: 18 18 19 18   Temp: 97.6 F (36.4 C) 98.5 F (36.9 C) 98.8 F (37.1 C) 98.1 F (36.7 C)  TempSrc: Oral Oral Oral Oral  SpO2: 100% 99% 98% 97%  Weight:   103.6 kg   Height:       Ph/e: General: appears comfortable, in no cute distress, responds to questions better today CV: RRR, nl S1S2 Abdomen: Soft, non tender to pulpation, BS present Extremities: no LEE, left hand ith non pitting edema, normal pulse, no erythema, no tenderness Neurology exam: Alert and oriented x 3  Assessment/Plan:  Principal Problem:   Transient loss of consciousness Active Problems:   Uncontrolled diabetes mellitus (HCC)   AKI (acute kidney injury) ([facility])   Pressure injury of skin   68 year old female living with uncontrolled diabetes type II, HTN, CAD, CVA,  brought to the emergency department by family member after an episode of altered mental status, then found to have acute on chronic renal failure and hypoglycemia  AcuteMetabolic Encephalopathy:Improved with IV fluid. She answers the questions better today Her symptoms were likely due to AKI on CKD and hypoglycemia in setting of dehydration and decreased PO intake. Her mental status and basic metabolic panel is improved to baseline now. Will do PT-OT eval and may DC home after that. I talked to patient's daughter "Lexine Baton", and updated her. Explained that her presentation was likely due to dehydration and not eating and drinking well recently. And dehydration while on HCTZ. We talked about importance of good PO intake for  her. All of her questions and concerns were addressed. Discussed the discharge plan likely today.  - Holding sedating meds (gabapetin and ropinirole) while in hospital - BMP daily - Holding HCTZ -PT/OT eval today and may DC if   T2DM with hypoglycemia: Hypoglycemia resolved She is on metformin 500 mg daily Lantus 15 units daily gabapentin 400 mg daily  - Continue Sensitive SSI. - Lantus 7 unit QD - q4h CBGs -BMP daily  -Holding gabapentin -Follow up with [facility] in 2 days Addendum: BG at 80-90s after 7 u of Lantus. Will hold Insulin at discharge,  Unless low dose of meal time Insulin if BG>150. and continue with Metformin. Daugther updated.  Hyperkalemia:Resolved with treatment and EKG normalized. K 4.4 today.   K was 6.3 on admission with EKG changes of widened QRS and peaked T waves. Received calcium gluconate, insulin, albuterol, and kayexalate. Was likely due to acute on chronic kidney injury.  -BMP after DC  Acuteon Chronic Kidney Disease: Cr now improved 1.6 today after IV hydration. Had good urine output yesterday. Creatine up to 2.8 on arrival from baseline of 1.2-1.3, likley due to dehydration. US showed chronic medical renal disease and simple cyst.  UA with rare bacteria and WBC and hyalin cast only. 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 Wheelchair bound per family. -Continue  Lipitor 10 mg daily  LLQ Pain and constipation: Improved  Abdominal US without acute intra-abdominal abnormality.  - Continue Miralax daily  Pressure skin injury: has dressing. No bacterial infection and no deep wound per wound nurse. Contact Dermatitis:Skin rash is likely 2/2 barrier cream use.  - Topical benadryl    FEN/GI: Carb-modified DVT PPX: Heparin  CODE Status: Full   Dispo: Anticipated discharge today  Dewayne Hatch, MD 11/20/2018, 6:10 AM Pager: [phone]

## 2018-11-20 NOTE — Discharge Summary (Signed)
Name: [name] [name] MRN: [id number] DOB: [birth year] 68 y.o. PCP: [name] Adie, MD  Date of Admission: 11/18/2018  5:59 PM Date of Discharge: 11/20/2018 Attending Physician: Dr. Aldine [name]  Discharge Diagnosis: 1.Principal Problem:   Transient loss of consciousness Active Problems:   Uncontrolled diabetes mellitus (HCC)   AKI (acute kidney injury) ([place])   Pressure injury of skin   Discharge Medications: Allergies as of 11/20/2018      Reactions   Accupril [quinapril Hcl] Other (See Comments)   "Allergic," per [facility]   Ciprofloxacin Hives   [place] [fish Allergy] Other (See Comments)   "Allergic," per [facility]      Medication List    STOP taking these medications   hydrochlorothiazide 25 MG tablet Commonly known as:  HYDRODIURIL   insulin aspart 100 UNIT/ML FlexPen Commonly known as:  NovoLOG FlexPen   Insulin Glargine 100 UNIT/ML Solostar Pen Commonly known as:  Lantus SoloStar     TAKE these medications   acetaminophen 650 MG CR tablet Commonly known as:  TYLENOL Take 650 mg by mouth 2 (two) times a day.   Aggrenox 200-25 MG 12hr capsule Generic drug:  dipyridamole-aspirin Take 1 capsule by mouth 2 (two) times daily. Stroke prevention   amLODipine 10 MG tablet Commonly known as:  NORVASC Take 10 mg by mouth daily. hypertension   atorvastatin 10 MG tablet Commonly known as:  LIPITOR Take 10 mg by mouth at bedtime.   BALMEX EX Apply 1 application topically 2 (two) times daily as needed (incontinence).   Biotene Dry Mouth Moisturizing Soln Use as directed 1 spray in the mouth or throat 6 (six) times daily.   buPROPion 300 MG 24 hr tablet Commonly known as:  WELLBUTRIN XL Take 300 mg by mouth daily. depression   cloNIDine 0.1 mg/24hr patch Commonly known as:  CATAPRES - Dosed in mg/24 hr Place 0.1 mg onto the skin every Sunday. hypertension   fluconazole 150 MG tablet Commonly known as:  DIFLUCAN Take 150 mg by  mouth every 7 (seven) days. On Thursdays, for 4 weeks   gabapentin 400 MG capsule Commonly known as:  NEURONTIN Take 400 mg by mouth at bedtime.   Magnesium 250 MG Tabs Take 250 mg by mouth daily.   Melatonin 5 MG Tabs Take 5 mg by mouth at bedtime as needed (for sleep).   metFORMIN 500 MG 24 hr tablet Commonly known as:  GLUCOPHAGE-XR Take 500 mg by mouth daily with breakfast.   metoprolol succinate 50 MG 24 hr tablet Commonly known as:  TOPROL-XL Take 50 mg by mouth daily. Take with or immediately following a meal.   nitroGLYCERIN 0.4 MG SL tablet Commonly known as:  NITROSTAT Place 0.4 mg under the tongue every 5 (five) minutes as needed for chest pain.   polyethylene glycol 17 g packet Commonly known as:  MIRALAX / GLYCOLAX Take 17 g by mouth daily as needed for mild constipation. Mix in 8 oz liquid and drink   potassium chloride SA 20 MEQ tablet Commonly known as:  K-DUR Take 20 mEq by mouth daily.   rOPINIRole 0.25 MG tablet Commonly known as:  REQUIP Take 0.25 mg by mouth at bedtime.   simethicone 125 MG chewable tablet Commonly known as:  MYLICON Chew 268 mg by mouth 3 (three) times daily as needed for flatulence (before meals as needed).       Disposition and follow-up:   [name] [name] [facility]condition.  At the hospital follow up visit please address:  1. Patient presented with acute encephalopathy due to hypoglycemia and AKI likely due to poor PO intake and dehydration. Please ensure, she has good po intake.   2. Insulin held at discharge to prevent hypoglycemia. Please monitor CBG and resume Insulin as needed.  3. HCTZ held due to dehydration. Please assess intake and volume status and decide about starting HCTZ.  4. Recheack potassium   5. Please examine and recheack the pressure injury of skin.     Labs / imaging needed at time of follow-up: BMP    Pending labs/ test needing follow-up:  None  Follow-up Appointments: Follow-up Information    [name] Adie, MD Follow up in 2 day(s).   Specialty:  Family Medicine Why:  See your primary care in 2-3 days Contact information: [address] [place] [address] [phone]           Hospital Course by problem list: 1- Acute metabolic encephalopathy:  Acute on chronic kidney injury: 43 year oldfemale with diabetes type II, HTN, CAD, CVA,  brought to the emergency department by her daugther after an episode of altered mental status, and probable pre-syncope while sitting on the toilet. She found to have hypoglycemia, acute on chronic renal failure with hyperkalemia on arrival. (Creatine up to 2.8 from baseline of 1.2-1.3.) Likely worsened in setting of dehydration. Other work up including head CT scan, infections or sepsis were negative. She admitted for further management. Her HCTZ and also centrally acting medicine (gabapetin and ropinirole) discontinued and she received IV fluid. Patient's mental status and kidney function improved.  Further work up including [place] and UA was unremarkable: US showed chronic medical renal disease and simple cyst. UA with rare bacteria and WBC and hyalin cast only and otherwise normal. Her creatinine is close to her baseline at discharge. [facility] and [facility] and encourgad to eat and drink to avoid dehydration.   2. Hyperkalemia: Patient withK 6.3 on admission with EKG changes of widened QRS and peaked T waves. Likely in setting of acute on chronic kidney injury. She received calcium gluconate, insulin and D50, albuterol, and kayexalate.Hyperkalemia resolved and repeated EKG is normalized.  3. Hypoglycemia: Patient with DM II, on Insulin and Metformin at home. Patient's blood sugars improved during hospitalization without antiglycemic control, how ever, after starting small dose of Insulin the next day, her blood sugar remained in the 80s to  90s.  We discontinued insulin on discharge and continue with metformin only.     4. Pressure injury of skin at gloteal area Contact dermatitis: Managed with topical treatmen barrier cream and topical benadryl for rash. Her symptoms improved. No evidence of skin infection or deep wound at discharge. Will need to be re examined and monitor after discharge.  Discharge Vitals:   BP (!) 144/73 (BP Location: Right Arm)   Pulse 84   Temp 98.3 F (36.8 C) (Oral)   Resp 18   Ht 5\' 7"  (1.702 m)   Wt 103.6 kg   SpO2 98%   BMI 35.77 kg/m   Pertinent Labs, Studies, and Procedures:  BMP Latest Ref Rng & Units 11/20/2018 11/19/2018 11/19/2018  Glucose 70 - 99 mg/dL 85 150(H) 181(H)  BUN 8 - 23 mg/dL 34(H) 38(H) 48(H)  Creatinine 0.44 - 1.00 mg/dL 1.62(H) 1.94(H) 2.57(H)  Sodium 135 - 145 mmol/L 137 138 137  Potassium 3.5 - 5.1  mmol/L 4.4 4.6 5.7(H)  Chloride 98 - 111 mmol/L 104 106 107  CO2 22 - 32 mmol/L 23 22 15(L)  Calcium 8.9 - 10.3 mg/dL 8.8(L) 9.0 9.1   U/A:  Ref Range & Units 11/19/2018 34mo ago 73yr ago  Color, Urine YELLOW YELLOW  YELLOW  STRAWAbnormal    APPearance CLEAR HAZYAbnormal   CLOUDYAbnormal   CLEAR   Specific Gravity, Urine 1.005 - 1.030 1.023  1.017  1.024   pH 5.0 - 8.0 5.0  7.0  5.0   Glucose, UA NEGATIVE mg/dL NEGATIVE  NEGATIVE  >=500Abnormal    Hgb urine dipstick NEGATIVE NEGATIVE  NEGATIVE  NEGATIVE   Bilirubin Urine NEGATIVE NEGATIVE  NEGATIVE  NEGATIVE   Ketones, ur NEGATIVE mg/dL NEGATIVE  NEGATIVE  20Abnormal    Protein, ur NEGATIVE mg/dL NEGATIVE  NEGATIVE  NEGATIVE   Nitrite NEGATIVE NEGATIVE  NEGATIVE  NEGATIVE   Leukocytes,Ua NEGATIVE MODERATEAbnormal   NEGATIVE CM   RBC / HPF 0 - 5 RBC/hpf 0-5   0-5   WBC, UA 0 - 5 WBC/hpf 6-10   0-5   Bacteria, UA NONE SEEN RAREAbnormal    RAREAbnormal    Squamous Epithelial / LPF 0 - 5 0-5   0-5 CM  Hyaline Casts, UA  PRESENT     Comment: Performed at [facility], [place] [address] [place], [place] [address]   Leukocytes, UA    NEGATIVE R  Mucus        Ref Range & Units 5/29  Sodium, Ur mmol/L <10     Ref Range & Units 5/29  Total CK 38 - 234 U/L 45      Ref Range & Units 5/29 4/29   Troponin I <0.03 ng/mL <0.03  <0.03 CM      Abdominal ultrasound 11/18/2018: IMPRESSION: 1. Increased echogenicity within the renal parenchyma with associated chronic cortical thinning, compatible with chronic medical renal disease. No hydronephrosis. 2. 4.8 cm simple exophytic cyst extending from the lower pole left kidney. 3. Otherwise unremarkable and normal abdominal ultrasound.  Head CT scan: 11/18/2018 FINDINGS: Brain: No evidence of acute infarction, hemorrhage, hydrocephalus, extra-axial collection or mass lesion/mass effect. Chronic microvascular ischemic change noted.  Vascular: Extensive atherosclerosis is seen.  Skull: Intact.  No focal lesion.  Sinuses/Orbits: Status post cataract surgery on the left. Otherwise negative.  Other: None.   CXR: FINDINGS: 11/18/2018 Heart is upper limits of normal in size and stable.  appearance of the mediastinal and hilar contours. Coronary artery stent suspected. The lungs are clear. No infiltrates or effusions. No worrisome pulmonary lesions. The bony thorax is intact.  IMPRESSION: No acute cardiopulmonary findings.  Discharge Instructions: Discharge Instructions    Diet - low sodium heart healthy   Complete by:  As directed    Discharge instructions   Complete by:  As directed    Thank you for allowing [place] taking care of you at [facility] We are glad that you feel better. Please do not take hydrochlorothiazide for next 2 days (until you see your primary doctor in [place]).  Make sure to drink enough water and eat well. Check your blood sugar 3 times daily. Inject only 2 units of Novolog with meal if your blood sugar is >150). Do not inject Lantus for next 2 day until you see your primary care doctor at [facility] next week) Please  continue rest of your medications as before. Make sure to see your primary care at [facility] on Monday to check  your blood sugar and adjust your Insulin and decide about resuming HCTZ. Thank you   Increase activity slowly   Complete by:  As directed       Signed: Dewayne Hatch, MD 11/23/2018, 5:58 PM   Pager: [phone]

## 2018-11-20 NOTE — Progress Notes (Signed)
Internal Medicine Attending:   I saw and examined the patient. I reviewed the resident's note and I agree with the resident's findings and plan as documented in the resident's note.  Patient feels well today and denies any new complaints.  She is awake and answering questions appropriately.  Patient initially admitted to the hospital with possible syncope and altered mental status in the setting of likely poor oral intake and was found to have AKI on CKD as well as hypoglycemia on admission.  Patient's blood sugars have improved but still remain in the 80s to 90s.  Will DC insulin on discharge and continue with metformin only.  Her acute metabolic encephalopathy has resolved with IV hydration and patient's oral intake is also improved.  Patient's AKI on CKD improved with IVF as well and her creatinine is close to her baseline.  Patient will need repeat BMP as an outpatient with her PCP.  PT/OT follow-up and recommendations appreciated.  Patient to continue with PT/OT at [facility].  No further work-up at this time.  Patient stable for DC home today.

## 2018-11-20 NOTE — Progress Notes (Signed)
Physical Therapy Evaluation Patient Details Name: [name] [name] MRN: [id number] DOB: 03-10-1951 Today's Date: 11/20/2018   History of Present Illness  This 68 y.o. fmeald admitted with decreased level of consciousness and was found to by hypoglycemic and acute on chronic renal failure.  Dx; acute metabolic encephalopathy.  PMH includes:  h/o CVA x 3 (w/c bound); poorly controlled DM   Clinical Impression  Patient appears to be at baseline function. She was able to transfer to a chair with sara plus like she does at home. She required assist with bed mobility but this she reports is baseline. It is difficult rto tell how much assist she required at home but given her lack of use of her left leg it would likely be at least min. She required mod from the hospital bed. She has not further use for skilled therapy in acute care.     Follow Up Recommendations Other (comment)(return to [facility] )    Equipment Recommendations       Recommendations for Other Services       Precautions / Restrictions Precautions Precautions: Fall Precaution Comments: used ser + for transfers  Restrictions Weight Bearing Restrictions: No      Mobility  Bed Mobility Overal bed mobility: Needs Assistance Bed Mobility: Supine to Sit     Supine to sit: Mod assist     General bed mobility comments: pt sitting up in chair   Transfers Overall transfer level: Needs assistance   Transfers: Sit to/from Stand Sit to Stand: Min guard         General transfer comment: Min gaurd required using the sera plus. Patient was bale to come  to near standing position with sera and transfer safely to the bed. She reports the transfer was simpilar to what she does every day.   Ambulation/Gait             General Gait Details: non ambualtory at baseline   Financial trader Rankin (Stroke Patients Only)       Balance Overall balance assessment: Needs  assistance Sitting-balance support: Bilateral upper extremity supported;Feet supported Sitting balance-Leahy Scale: Poor Sitting balance - Comments: was able to self correct but often fell to the right                                      Pertinent Vitals/Pain Pain Assessment: No/denies pain    Home Living Family/patient expects to be discharged to:: Private residence Living Arrangements: Children Available Help at Discharge: Family;Personal care attendant;Available PRN/intermittently Type of Home: House Home Access: Level entry     Home Layout: One level Home Equipment: Bedside commode;Wheelchair - Education administrator (comment)(sara plus lift at home ) Additional Comments: [facility] a client of [facility] and goas to the adult day program via transport [place], and receives PT/OT.  Pt reports she has a sara plus and has an aid with her 2hours/day who assists with ADLs.  Pt reports she mostly uses w/c at home and uses sara plus to transfer onto [facility]  She reports daughter works from home     Prior Function Level of Independence: Needs assistance   Gait / Transfers Assistance Needed: Pt requires use of sara plus for transfers.  Is w/c bound   ADL's / Homemaking Assistance Needed: Pt reports she feeds herself, and requires assist for all  other aspects of ADLs         Hand Dominance   Dominant Hand: Right(but mostly uses Lt UE due to Rt weakness )    Extremity/Trunk Assessment   Upper Extremity Assessment Upper Extremity Assessment: RUE deficits/detail;LUE deficits/detail RUE Deficits / Details: Pt with residual weakness from previous CVA.  She is able to elevate shoulder to ~75*, and demonstrates gross grasp/release  RUE Coordination: decreased fine motor;decreased gross motor LUE Deficits / Details: decreased [place]  LUE Coordination: decreased fine motor    Lower Extremity Assessment Lower Extremity Assessment: Defer to PT evaluation RLE Deficits / Details: little active movement  if the right LE with treatment        Communication   Communication: No difficulties  Cognition Arousal/Alertness: Awake/alert Behavior During Therapy: Flat affect Overall Cognitive Status: No family/caregiver present to determine baseline cognitive functioning                                 General Comments: Pt able to provide info re: PLOF, but no family present to provide info re: pt's leve of functioning       General Comments      Exercises     Assessment/Plan    PT Assessment Patent does not need any further PT services  PT Problem List         PT Treatment Interventions      PT Goals (Current goals can be found in the Care Plan section)  Acute Rehab PT Goals Patient Stated Goal: to go home  PT Goal Formulation: With patient Time For Goal Achievement: 11/27/18    Frequency     Barriers to discharge        Co-evaluation               AM-PAC PT "6 Clicks" Mobility  Outcome Measure Help needed turning from your back to your side while in a flat bed without using bedrails?: A Lot Help needed moving from lying on your back to sitting on the side of a flat bed without using bedrails?: A Lot Help needed moving to and from a bed to a chair (including a wheelchair)?: A Lot Help needed standing up from a chair using your arms (e.g., wheelchair or bedside chair)?: A Lot Help needed to walk in hospital room?: A Lot Help needed climbing 3-5 steps with a railing? : A Lot 6 Click Score: 12    End of Session   Activity Tolerance: Patient tolerated treatment well Patient left: in chair;with call bell/phone within reach;with nursing/sitter in room Nurse Communication: Mobility status;Need for lift equipment PT Visit Diagnosis: Other abnormalities of gait and mobility (R26.89)    Time: 1035-1105 PT Time Calculation (min) (ACUTE ONLY): 30 min   Charges:   PT Evaluation $PT Eval Moderate Complexity: 1 Mod          Carney Living PT DPT   11/20/2018, 11:55 AM

## 2018-11-20 NOTE — Evaluation (Signed)
Occupational Therapy Evaluation Patient Details Name: [name] [name] MRN: [id number] DOB: June 13, 1951 Today's Date: 11/20/2018    History of Present Illness This 68 y.o. fmeald admitted with decreased level of consciousness and was found to by hypoglycemic and acute on chronic renal failure.  Dx; acute metabolic encephalopathy.  PMH includes:  h/o CVA x 3 (w/c bound); poorly controlled DM    Clinical Impression   Patient evaluated by Occupational Therapy with no further acute OT needs identified. All education has been completed and the patient has no further questions. Pt appears close to baseline, per her report. Recommend she resume PT/OT through [facility].  She may need ambulance transfer home.  See below for any follow-up Occupational Therapy or equipment needs. OT is signing off. Thank you for this referral.      Follow Up Recommendations  Outpatient OT(via [facility] )    Equipment Recommendations  None recommended by OT    Recommendations for Other Services       Precautions / Restrictions Precautions Precautions: Fall Precaution Comments: used ser + for transfers  Restrictions Weight Bearing Restrictions: No      Mobility Bed Mobility Overal bed mobility: Needs Assistance Bed Mobility: Supine to Sit     Supine to sit: Mod assist     General bed mobility comments: pt sitting up in chair   Transfers Overall transfer level: Needs assistance   Transfers: Sit to/from Stand Sit to Stand: Min guard         General transfer comment: Min gaurd required using the sera plus. Patient was bale to come  to near standing position with sera and transfer safely to the bed. She reports the transfer was simpilar to what she does every day.     Balance Overall balance assessment: Needs assistance Sitting-balance support: Bilateral upper extremity supported;Feet supported Sitting balance-Leahy Scale: Poor Sitting balance - Comments: was able to self correct but often fell to the  right                                    ADL either performed or assessed with clinical judgement   ADL Overall ADL's : Needs assistance/impaired Eating/Feeding: Set up;Sitting   Grooming: Oral care;Wash/dry face;Wash/dry hands;Moderate assistance;Sitting   Upper Body Bathing: Moderate assistance;Sitting   Lower Body Bathing: Maximal assistance;Sit to/from stand   Upper Body Dressing : Maximal assistance;Sitting   Lower Body Dressing: Total assistance;Sit to/from stand   Toilet Transfer: Moderate assistance;Stand-pivot;BSC Toilet Transfer Details (indicate cue type and reason): with use of sara plus          Functional mobility during ADLs: Moderate assistance       Vision Baseline Vision/History: No visual deficits Patient Visual Report: No change from baseline Additional Comments: Pt demonstrates dysconjugate gaze, but denies diplopia      Perception     Praxis      Pertinent Vitals/Pain Pain Assessment: No/denies pain     Hand Dominance Right(but mostly uses Lt UE due to Rt weakness )   Extremity/Trunk Assessment Upper Extremity Assessment Upper Extremity Assessment: RUE deficits/detail;LUE deficits/detail RUE Deficits / Details: Pt with residual weakness from previous CVA.  She is able to elevate shoulder to ~75*, and demonstrates gross grasp/release  RUE Coordination: decreased fine motor;decreased gross motor LUE Deficits / Details: decreased [place]  LUE Coordination: decreased fine motor   Lower Extremity Assessment Lower Extremity Assessment: Defer to PT evaluation RLE Deficits /  Details: little active movement if the right LE with treatment        Communication Communication Communication: No difficulties   Cognition Arousal/Alertness: Awake/alert Behavior During Therapy: Flat affect Overall Cognitive Status: No family/caregiver present to determine baseline cognitive functioning                                  General Comments: Pt able to provide info re: PLOF, but no family present to provide info re: pt's leve of functioning    General Comments       Exercises     Shoulder Instructions      Home Living Family/patient expects to be discharged to:: Private residence Living Arrangements: Children Available Help at Discharge: Family;Personal care attendant;Available PRN/intermittently Type of Home: House Home Access: Level entry     Home Layout: One level               Home Equipment: Bedside commode;Wheelchair - Education administrator (comment)(sara plus lift at home )   Additional Comments: Pt is a client of [facility] and goas to the adult day program via transport [place], and receives PT/OT.  Pt reports she has a sara plus and has an aid with her 2hours/day who assists with ADLs.  Pt reports she mostly uses w/c at home and uses sara plus to transfer onto [facility].  She reports daughter works from home       Prior Functioning/Environment Level of Independence: Needs assistance  Gait / Transfers Assistance Needed: Pt requires use of sara plus for transfers.  Is w/c bound  ADL's / Homemaking Assistance Needed: Pt reports she feeds herself, and requires assist for all other aspects of ADLs             OT Problem List: Decreased strength;Decreased activity tolerance;Decreased range of motion;Impaired balance (sitting and/or standing);Decreased coordination;Decreased cognition;Obesity;Impaired UE functional use      OT Treatment/Interventions:      OT Goals(Current goals can be found in the care plan section) Acute Rehab OT Goals Patient Stated Goal: to go home  OT Goal Formulation: All assessment and education complete, DC therapy  OT Frequency:     Barriers to D/C:            Co-evaluation              AM-PAC OT "6 Clicks" Daily Activity     Outcome Measure Help from another person eating meals?: None Help from another person taking care of personal grooming?: A Little Help from  another person toileting, which includes using toliet, bedpan, or urinal?: A Lot Help from another person bathing (including washing, rinsing, drying)?: A Lot Help from another person to put on and taking off regular upper body clothing?: A Lot Help from another person to put on and taking off regular lower body clothing?: Total 6 Click Score: 14   End of Session Nurse Communication: Mobility status  Activity Tolerance: Patient tolerated treatment well Patient left: in chair;with call bell/phone within reach  OT Visit Diagnosis: Muscle weakness (generalized) (M62.81)                Time: 3570-1779 OT Time Calculation (min): 9 min Charges:  OT General Charges $OT Visit: 1 Visit OT Evaluation $OT Eval Moderate Complexity: 1 Mod  Lucille Passy, OTR/L Acute Rehabilitation Services Pager [phone] Office [phone]   Lucille Passy M 11/20/2018, 11:40 AM

## 2018-11-20 NOTE — Progress Notes (Signed)
DISCHARGE NOTE  Randall An to be discharged Home per MD order. Patient verbalized understanding.  Skin clean, dry and intact without evidence of skin break down, no evidence of skin tears noted. IV catheter discontinued intact. Site without signs and symptoms of complications. Dressing and pressure applied. Pt denies pain at the site currently. No complaints noted.  Patient free of lines, drains, and wounds.   Discharge packet assembled. An After Visit Summary (AVS) was printed and given to patient. Patient escorted via wheelchair and discharged to home via [facility] transportation     . Babs Sciara, RN

## 2018-11-23 LAB — CULTURE, BLOOD (ROUTINE X 2)
Culture: NO GROWTH
Culture: NO GROWTH

## 2018-11-23 LAB — GLUCOSE, CAPILLARY
Glucose-Capillary: 179 mg/dL — ABNORMAL HIGH (ref 70–99)
Glucose-Capillary: 237 mg/dL — ABNORMAL HIGH (ref 70–99)
Glucose-Capillary: 99 mg/dL (ref 70–99)

## 2019-01-18 ENCOUNTER — Other Ambulatory Visit: Payer: Self-pay | Admitting: Family Medicine

## 2019-01-18 DIAGNOSIS — Z1231 Encounter for screening mammogram for malignant neoplasm of breast: Secondary | ICD-10-CM

## 2019-03-07 ENCOUNTER — Other Ambulatory Visit: Payer: Self-pay

## 2019-03-07 ENCOUNTER — Ambulatory Visit
Admission: RE | Admit: 2019-03-07 | Discharge: 2019-03-07 | Disposition: A | Discharge status: Home / Self Care | Payer: Medicare (Managed Care) | Source: Ambulatory Visit | Attending: Family Medicine | Admitting: Family Medicine

## 2019-03-07 DIAGNOSIS — Z1231 Encounter for screening mammogram for malignant neoplasm of breast: Secondary | ICD-10-CM

## 2019-03-21 ENCOUNTER — Other Ambulatory Visit: Payer: Self-pay | Admitting: Internal Medicine

## 2019-03-21 DIAGNOSIS — Z1231 Encounter for screening mammogram for malignant neoplasm of breast: Secondary | ICD-10-CM

## 2020-02-03 ENCOUNTER — Inpatient Hospital Stay (HOSPITAL_COMMUNITY)
Admission: EM | Admit: 2020-02-03 | Discharge: 2020-02-06 | DRG: 065 | Disposition: A | Discharge status: Home / Self Care | Payer: Medicare (Managed Care) | Attending: Internal Medicine | Admitting: Internal Medicine

## 2020-02-03 ENCOUNTER — Emergency Department (HOSPITAL_COMMUNITY): Payer: Medicare (Managed Care)

## 2020-02-03 ENCOUNTER — Encounter (HOSPITAL_COMMUNITY): Payer: Self-pay

## 2020-02-03 DIAGNOSIS — M1990 Unspecified osteoarthritis, unspecified site: Secondary | ICD-10-CM | POA: Diagnosis present

## 2020-02-03 DIAGNOSIS — Z20822 Contact with and (suspected) exposure to covid-19: Secondary | ICD-10-CM | POA: Diagnosis present

## 2020-02-03 DIAGNOSIS — E785 Hyperlipidemia, unspecified: Secondary | ICD-10-CM | POA: Diagnosis present

## 2020-02-03 DIAGNOSIS — E1122 Type 2 diabetes mellitus with diabetic chronic kidney disease: Secondary | ICD-10-CM | POA: Diagnosis present

## 2020-02-03 DIAGNOSIS — R1311 Dysphagia, oral phase: Secondary | ICD-10-CM | POA: Diagnosis present

## 2020-02-03 DIAGNOSIS — Z888 Allergy status to other drugs, medicaments and biological substances status: Secondary | ICD-10-CM

## 2020-02-03 DIAGNOSIS — Z7984 Long term (current) use of oral hypoglycemic drugs: Secondary | ICD-10-CM

## 2020-02-03 DIAGNOSIS — I6381 Other cerebral infarction due to occlusion or stenosis of small artery: Principal | ICD-10-CM | POA: Diagnosis present

## 2020-02-03 DIAGNOSIS — Z993 Dependence on wheelchair: Secondary | ICD-10-CM

## 2020-02-03 DIAGNOSIS — R531 Weakness: Secondary | ICD-10-CM

## 2020-02-03 DIAGNOSIS — Z66 Do not resuscitate: Secondary | ICD-10-CM | POA: Diagnosis present

## 2020-02-03 DIAGNOSIS — Z833 Family history of diabetes mellitus: Secondary | ICD-10-CM

## 2020-02-03 DIAGNOSIS — Z881 Allergy status to other antibiotic agents status: Secondary | ICD-10-CM

## 2020-02-03 DIAGNOSIS — R29810 Facial weakness: Secondary | ICD-10-CM | POA: Diagnosis present

## 2020-02-03 DIAGNOSIS — N1831 Chronic kidney disease, stage 3a: Secondary | ICD-10-CM | POA: Diagnosis present

## 2020-02-03 DIAGNOSIS — G8194 Hemiplegia, unspecified affecting left nondominant side: Secondary | ICD-10-CM | POA: Diagnosis present

## 2020-02-03 DIAGNOSIS — I69351 Hemiplegia and hemiparesis following cerebral infarction affecting right dominant side: Secondary | ICD-10-CM

## 2020-02-03 DIAGNOSIS — Z91013 Allergy to seafood: Secondary | ICD-10-CM

## 2020-02-03 DIAGNOSIS — F329 Major depressive disorder, single episode, unspecified: Secondary | ICD-10-CM | POA: Diagnosis present

## 2020-02-03 DIAGNOSIS — Z79899 Other long term (current) drug therapy: Secondary | ICD-10-CM

## 2020-02-03 DIAGNOSIS — Z7902 Long term (current) use of antithrombotics/antiplatelets: Secondary | ICD-10-CM

## 2020-02-03 DIAGNOSIS — R29708 NIHSS score 8: Secondary | ICD-10-CM | POA: Diagnosis present

## 2020-02-03 LAB — DIFFERENTIAL
Abs Immature Granulocytes: 0.01 10*3/uL (ref 0.00–0.07)
Basophils Absolute: 0 10*3/uL (ref 0.0–0.1)
Basophils Relative: 0 %
Eosinophils Absolute: 0.3 10*3/uL (ref 0.0–0.5)
Eosinophils Relative: 6 %
Immature Granulocytes: 0 %
Lymphocytes Relative: 28 %
Lymphs Abs: 1.6 10*3/uL (ref 0.7–4.0)
Monocytes Absolute: 0.4 10*3/uL (ref 0.1–1.0)
Monocytes Relative: 7 %
Neutro Abs: 3.3 10*3/uL (ref 1.7–7.7)
Neutrophils Relative %: 59 %

## 2020-02-03 LAB — I-STAT CHEM 8, ED
BUN: 27 mg/dL — ABNORMAL HIGH (ref 8–23)
Calcium, Ion: 1.2 mmol/L (ref 1.15–1.40)
Chloride: 108 mmol/L (ref 98–111)
Creatinine, Ser: 1.3 mg/dL — ABNORMAL HIGH (ref 0.44–1.00)
Glucose, Bld: 113 mg/dL — ABNORMAL HIGH (ref 70–99)
HCT: 34 % — ABNORMAL LOW (ref 36.0–46.0)
Hemoglobin: 11.6 g/dL — ABNORMAL LOW (ref 12.0–15.0)
Potassium: 4.1 mmol/L (ref 3.5–5.1)
Sodium: 142 mmol/L (ref 135–145)
TCO2: 19 mmol/L — ABNORMAL LOW (ref 22–32)

## 2020-02-03 LAB — COMPREHENSIVE METABOLIC PANEL
ALT: 9 U/L (ref 0–44)
AST: 15 U/L (ref 15–41)
Albumin: 3.7 g/dL (ref 3.5–5.0)
Alkaline Phosphatase: 78 U/L (ref 38–126)
Anion gap: 12 (ref 5–15)
BUN: 27 mg/dL — ABNORMAL HIGH (ref 8–23)
CO2: 20 mmol/L — ABNORMAL LOW (ref 22–32)
Calcium: 9.7 mg/dL (ref 8.9–10.3)
Chloride: 108 mmol/L (ref 98–111)
Creatinine, Ser: 1.52 mg/dL — ABNORMAL HIGH (ref 0.44–1.00)
GFR calc Af Amer: 40 mL/min — ABNORMAL LOW (ref >60)
GFR calc non Af Amer: 35 mL/min — ABNORMAL LOW (ref >60)
Glucose, Bld: 118 mg/dL — ABNORMAL HIGH (ref 70–99)
Potassium: 4.2 mmol/L (ref 3.5–5.1)
Sodium: 140 mmol/L (ref 135–145)
Total Bilirubin: 0.5 mg/dL (ref 0.3–1.2)
Total Protein: 6.6 g/dL (ref 6.5–8.1)

## 2020-02-03 LAB — CBC
HCT: 35.1 % — ABNORMAL LOW (ref 36.0–46.0)
Hemoglobin: 10.7 g/dL — ABNORMAL LOW (ref 12.0–15.0)
MCH: 30.7 pg (ref 26.0–34.0)
MCHC: 30.5 g/dL (ref 30.0–36.0)
MCV: 100.9 fL — ABNORMAL HIGH (ref 80.0–100.0)
Platelets: 219 10*3/uL (ref 150–400)
RBC: 3.48 MIL/uL — ABNORMAL LOW (ref 3.87–5.11)
RDW: 12.8 % (ref 11.5–15.5)
WBC: 5.6 10*3/uL (ref 4.0–10.5)
nRBC: 0 % (ref 0.0–0.2)

## 2020-02-03 LAB — PROTIME-INR
INR: 1.1 (ref 0.8–1.2)
Prothrombin Time: 13.6 seconds (ref 11.4–15.2)

## 2020-02-03 LAB — APTT: aPTT: 32 seconds (ref 24–36)

## 2020-02-03 NOTE — ED Triage Notes (Signed)
Pt arrives POV sent in by [facility] for progressively worsening L sided weakness. Pt reports hx of 3 strokes, 2 affecting L 1 affecting R. Pt reports LKN Wednesday night before bed, yesterday AM awoke at 0630 to worse L sided weakness. Pt w/ slurred speech which she reports is normal. Here for CT scan for r/o new/worsening CVA.

## 2020-02-04 ENCOUNTER — Observation Stay (HOSPITAL_COMMUNITY): Payer: Medicare (Managed Care)

## 2020-02-04 ENCOUNTER — Other Ambulatory Visit: Payer: Self-pay

## 2020-02-04 DIAGNOSIS — Z993 Dependence on wheelchair: Secondary | ICD-10-CM | POA: Diagnosis not present

## 2020-02-04 DIAGNOSIS — R1311 Dysphagia, oral phase: Secondary | ICD-10-CM | POA: Diagnosis present

## 2020-02-04 DIAGNOSIS — I6381 Other cerebral infarction due to occlusion or stenosis of small artery: Secondary | ICD-10-CM | POA: Diagnosis present

## 2020-02-04 DIAGNOSIS — H409 Unspecified glaucoma: Secondary | ICD-10-CM

## 2020-02-04 DIAGNOSIS — Z833 Family history of diabetes mellitus: Secondary | ICD-10-CM | POA: Diagnosis not present

## 2020-02-04 DIAGNOSIS — N1831 Chronic kidney disease, stage 3a: Secondary | ICD-10-CM | POA: Diagnosis present

## 2020-02-04 DIAGNOSIS — Z91013 Allergy to seafood: Secondary | ICD-10-CM | POA: Diagnosis not present

## 2020-02-04 DIAGNOSIS — Z7984 Long term (current) use of oral hypoglycemic drugs: Secondary | ICD-10-CM | POA: Diagnosis not present

## 2020-02-04 DIAGNOSIS — E785 Hyperlipidemia, unspecified: Secondary | ICD-10-CM | POA: Diagnosis present

## 2020-02-04 DIAGNOSIS — M1990 Unspecified osteoarthritis, unspecified site: Secondary | ICD-10-CM | POA: Diagnosis present

## 2020-02-04 DIAGNOSIS — E119 Type 2 diabetes mellitus without complications: Secondary | ICD-10-CM

## 2020-02-04 DIAGNOSIS — R29810 Facial weakness: Secondary | ICD-10-CM | POA: Diagnosis present

## 2020-02-04 DIAGNOSIS — E1122 Type 2 diabetes mellitus with diabetic chronic kidney disease: Secondary | ICD-10-CM | POA: Diagnosis present

## 2020-02-04 DIAGNOSIS — I69351 Hemiplegia and hemiparesis following cerebral infarction affecting right dominant side: Secondary | ICD-10-CM

## 2020-02-04 DIAGNOSIS — Z20822 Contact with and (suspected) exposure to covid-19: Secondary | ICD-10-CM | POA: Diagnosis present

## 2020-02-04 DIAGNOSIS — Z881 Allergy status to other antibiotic agents status: Secondary | ICD-10-CM | POA: Diagnosis not present

## 2020-02-04 DIAGNOSIS — G8194 Hemiplegia, unspecified affecting left nondominant side: Secondary | ICD-10-CM | POA: Diagnosis present

## 2020-02-04 DIAGNOSIS — I6389 Other cerebral infarction: Secondary | ICD-10-CM | POA: Diagnosis not present

## 2020-02-04 DIAGNOSIS — F329 Major depressive disorder, single episode, unspecified: Secondary | ICD-10-CM

## 2020-02-04 DIAGNOSIS — Z888 Allergy status to other drugs, medicaments and biological substances status: Secondary | ICD-10-CM | POA: Diagnosis not present

## 2020-02-04 DIAGNOSIS — Z7902 Long term (current) use of antithrombotics/antiplatelets: Secondary | ICD-10-CM | POA: Diagnosis not present

## 2020-02-04 DIAGNOSIS — I10 Essential (primary) hypertension: Secondary | ICD-10-CM | POA: Diagnosis not present

## 2020-02-04 DIAGNOSIS — R531 Weakness: Secondary | ICD-10-CM | POA: Diagnosis present

## 2020-02-04 DIAGNOSIS — Z79899 Other long term (current) drug therapy: Secondary | ICD-10-CM | POA: Diagnosis not present

## 2020-02-04 DIAGNOSIS — Z66 Do not resuscitate: Secondary | ICD-10-CM | POA: Diagnosis present

## 2020-02-04 DIAGNOSIS — R29708 NIHSS score 8: Secondary | ICD-10-CM | POA: Diagnosis present

## 2020-02-04 LAB — COMPREHENSIVE METABOLIC PANEL
ALT: 9 U/L (ref 0–44)
AST: 15 U/L (ref 15–41)
Albumin: 3.8 g/dL (ref 3.5–5.0)
Alkaline Phosphatase: 76 U/L (ref 38–126)
Anion gap: 13 (ref 5–15)
BUN: 28 mg/dL — ABNORMAL HIGH (ref 8–23)
CO2: 19 mmol/L — ABNORMAL LOW (ref 22–32)
Calcium: 9.6 mg/dL (ref 8.9–10.3)
Chloride: 108 mmol/L (ref 98–111)
Creatinine, Ser: 1.37 mg/dL — ABNORMAL HIGH (ref 0.44–1.00)
GFR calc Af Amer: 46 mL/min — ABNORMAL LOW (ref >60)
GFR calc non Af Amer: 40 mL/min — ABNORMAL LOW (ref >60)
Glucose, Bld: 86 mg/dL (ref 70–99)
Potassium: 4.5 mmol/L (ref 3.5–5.1)
Sodium: 140 mmol/L (ref 135–145)
Total Bilirubin: 0.7 mg/dL (ref 0.3–1.2)
Total Protein: 6.8 g/dL (ref 6.5–8.1)

## 2020-02-04 LAB — GLUCOSE, CAPILLARY: Glucose-Capillary: 159 mg/dL — ABNORMAL HIGH (ref 70–99)

## 2020-02-04 LAB — CBG MONITORING, ED: Glucose-Capillary: 82 mg/dL (ref 70–99)

## 2020-02-04 LAB — SARS CORONAVIRUS 2 BY RT PCR (HOSPITAL ORDER, PERFORMED IN ~~LOC~~ HOSPITAL LAB): SARS Coronavirus 2: NEGATIVE

## 2020-02-04 LAB — LACTIC ACID, PLASMA: Lactic Acid, Venous: 1.8 mmol/L (ref 0.5–1.9)

## 2020-02-04 LAB — PROTIME-INR
INR: 1.1 (ref 0.8–1.2)
Prothrombin Time: 13.5 seconds (ref 11.4–15.2)

## 2020-02-04 LAB — TSH: TSH: 2.292 u[IU]/mL (ref 0.350–4.500)

## 2020-02-04 LAB — MAGNESIUM: Magnesium: 2.1 mg/dL (ref 1.7–2.4)

## 2020-02-04 LAB — PHOSPHORUS: Phosphorus: 3.8 mg/dL (ref 2.5–4.6)

## 2020-02-04 LAB — HIV ANTIBODY (ROUTINE TESTING W REFLEX): HIV Screen 4th Generation wRfx: NONREACTIVE

## 2020-02-04 LAB — VITAMIN B12: Vitamin B-12: 197 pg/mL (ref 180–914)

## 2020-02-04 MED ORDER — ENOXAPARIN SODIUM 40 MG/0.4ML ~~LOC~~ SOLN
40.0000 mg | SUBCUTANEOUS | Status: DC
  Administered 2020-02-05 – 2020-02-06 (×2): 40 mg via SUBCUTANEOUS
  Filled 2020-02-04 (×2): qty 0.4

## 2020-02-04 MED ORDER — LACTATED RINGERS IV BOLUS
500.0000 mL | Freq: Once | INTRAVENOUS | Status: AC
  Administered 2020-02-04: 500 mL via INTRAVENOUS

## 2020-02-04 MED ORDER — METOPROLOL SUCCINATE ER 50 MG PO TB24
50.0000 mg | ORAL_TABLET | Freq: Every day | ORAL | Status: DC
  Administered 2020-02-04 – 2020-02-06 (×3): 50 mg via ORAL
  Filled 2020-02-04: qty 1
  Filled 2020-02-04: qty 2
  Filled 2020-02-04: qty 1

## 2020-02-04 MED ORDER — ATORVASTATIN CALCIUM 80 MG PO TABS
80.0000 mg | ORAL_TABLET | Freq: Every day | ORAL | Status: DC
  Administered 2020-02-04 – 2020-02-06 (×3): 80 mg via ORAL
  Filled 2020-02-04 (×3): qty 1

## 2020-02-04 MED ORDER — GABAPENTIN 400 MG PO CAPS
400.0000 mg | ORAL_CAPSULE | Freq: Every day | ORAL | Status: DC
  Administered 2020-02-04 – 2020-02-05 (×2): 400 mg via ORAL
  Filled 2020-02-04 (×2): qty 1

## 2020-02-04 MED ORDER — BRIMONIDINE TARTRATE 0.2 % OP SOLN
1.0000 [drp] | Freq: Three times a day (TID) | OPHTHALMIC | Status: DC
  Administered 2020-02-05 – 2020-02-06 (×3): 1 [drp] via OPHTHALMIC
  Filled 2020-02-04: qty 5

## 2020-02-04 MED ORDER — BUPROPION HCL ER (XL) 150 MG PO TB24
300.0000 mg | ORAL_TABLET | Freq: Every day | ORAL | Status: DC
  Administered 2020-02-04 – 2020-02-06 (×3): 300 mg via ORAL
  Filled 2020-02-04 (×3): qty 2

## 2020-02-04 MED ORDER — ASPIRIN-DIPYRIDAMOLE ER 25-200 MG PO CP12: 1.0000 | ORAL_CAPSULE | Freq: Every day | ORAL | Status: DC

## 2020-02-04 MED ORDER — CLOPIDOGREL BISULFATE 300 MG PO TABS
300.0000 mg | ORAL_TABLET | Freq: Once | ORAL | Status: AC
  Administered 2020-02-04: 300 mg via ORAL
  Filled 2020-02-04: qty 1

## 2020-02-04 MED ORDER — ASPIRIN-DIPYRIDAMOLE ER 25-200 MG PO CP12
1.0000 | ORAL_CAPSULE | Freq: Two times a day (BID) | ORAL | Status: DC
  Filled 2020-02-04 (×2): qty 1

## 2020-02-04 MED ORDER — INSULIN ASPART 100 UNIT/ML ~~LOC~~ SOLN
0.0000 [IU] | Freq: Three times a day (TID) | SUBCUTANEOUS | Status: DC
  Administered 2020-02-05: 2 [IU] via SUBCUTANEOUS
  Administered 2020-02-05: 3 [IU] via SUBCUTANEOUS
  Administered 2020-02-06 (×2): 2 [IU] via SUBCUTANEOUS

## 2020-02-04 MED ORDER — BRINZOLAMIDE 1 % OP SUSP
1.0000 [drp] | Freq: Three times a day (TID) | OPHTHALMIC | Status: DC
  Administered 2020-02-05 – 2020-02-06 (×3): 1 [drp] via OPHTHALMIC
  Filled 2020-02-04: qty 10

## 2020-02-04 MED ORDER — CLOPIDOGREL BISULFATE 75 MG PO TABS
75.0000 mg | ORAL_TABLET | Freq: Every day | ORAL | Status: DC
  Administered 2020-02-05 – 2020-02-06 (×2): 75 mg via ORAL
  Filled 2020-02-04 (×2): qty 1

## 2020-02-04 MED ORDER — ASPIRIN EC 81 MG PO TBEC
81.0000 mg | DELAYED_RELEASE_TABLET | Freq: Every day | ORAL | Status: DC
  Administered 2020-02-04 – 2020-02-06 (×3): 81 mg via ORAL
  Filled 2020-02-04 (×3): qty 1

## 2020-02-04 NOTE — H&P (Signed)
Date: 02/04/2020               Patient Name:  [name] [name] MRN: [id number]  DOB: Nov 08, 1950 Age / Sex: 69 y.o., female   PCP: Janifer Adie, MD         Medical Service: Internal Medicine Teaching Service         Attending Physician: Dr. Jimmye Norman, Elaina Pattee, MD    First Contact: Dr. Sharon Seller Pager: [phone]  Second Contact: Dr. Konrad Penta Pager: [phone]       After Hours (After 5p/  First Contact Pager: [phone]  weekends / holidays): Second Contact Pager: [phone]   Chief Complaint: Left-sided weakness  History of Present Illness:  [name] [name] is a 69 y.o. F w/ PMHx CVA x 3 with residual right-sided weakness, TIIDM, HTN, HLD, and arthritis presenting with 2 days of left-sided weakness. She states that when she woke up Thursday morning, she noticed weakness in her right arm and right leg and states they felt "heavy". She says her weakness has remained constant since onset, and that she is barely able to move her left leg. She says [facility] to [facility] recently and was told to go to the hospital. She notes that she has chronic right-sided weakness from prior stroke that has remained unchanged. She states she also cannot see much out of her left eye since she had elevated pressures in the eye in the past and denies any new changes in vision. No CP, headaches, dizziness, light-headedness, numbness, tingling, difficulty urinating, increased leg swelling. She does note she hasn't eaten or drank much today due to decreased appetite.   Social:  She lives at home with her son and brother who help her at home.  She only smoked for a short time when she was much younger.  She denies alcohol use or other drug use.   Family History: Her mother, sister and brother had DM.  Home Medications:  Metformin 500mg  daily Toprol-XL 50mg  daily SL NTG PRN Amlodipine 10mg  daily  Atorvastatin 10mg  nightly Clinidine 0.1mg  weekly Bupropion 300mg  daily Gabapentin 400mg  nightly Ropinirole  0.25mg  nightly  Magnesium 250 daily  KCl SA 79mEq daily Miralax 17g daily Simethicone 125mg  TID  Zinc Oxide BID  Tylenol 650mg  CR BID  Artificial Saliva  Melatonin 5mg  nightly  Aggrenox 200-25 BID   Allergies: Allergies as of 02/03/2020 - Review Complete 02/03/2020  Allergen Reaction Noted  . Accupril [quinapril hcl] Other (See Comments) 11/19/2018  . Ciprofloxacin Hives 10/18/2017  . Salmon [fish allergy] Other (See Comments) 11/19/2018   Past Medical History:  Diagnosis Date  . Arthritis   . Diabetes mellitus without complication ([place])   . Stroke [facility])     Review of Systems: A complete ROS was negative except as per HPI.   Physical Exam: Blood pressure (!) 183/85, pulse 77, temperature 97.6 F (36.4 C), temperature source Oral, resp. rate 16, height 5\' 7"  (1.702 m), weight 104 kg, SpO2 98 %. General: NAD, supine in bed, very tired appearing.  Eyes: L pupil is not reactive to light or accomodation. Sclera non-icteric. No conjunctival injection.  HENT: No nasal discharge. Head appears atraumatic.  Respiratory: Lungs are CTA, bilaterally. No wheezes, rales, or rhonchi.  Cardiovascular: Regular rate and rhythm. No murmurs, rubs, or gallops. There is trace pitting edema of the lower shins with more significant swelling around the feet and ankles.  Abdominal: Soft and non-tender to palpation. Bowel sounds intact. No rebound or guarding. Neurological: 3/5 strength in  bilateral upper extremities. 2/5 strength in RLE. 1/5 strength in LLE. Sensation to light touch present and equal, bilaterally. Lower facial droop present. L pupil not reactive to light and accomodation with no visual acuity. Intermittent right hemi-neglect. CN II-XII otherwise intact. Coordination intact.  Skin: Periorbital edema is present. No lesions. No rashes.  Psych: Flat affect. Soft tone of voice.    EKG: personally reviewed my interpretation is NSR at 79bpm. High voltage in lateral leads consistent with LVH.  Low voltage in V3-V6. T wave inversions in leads V1, V3-V6, aVR, III and AVF. All but aVR T wave inversions are new since prior EKG 11/19/18. Low voltage V3-V6. No ST segment elevations or depressions.   CT Head wo Contrast:  IMPRESSION: 1. No acute intracranial abnormality. 2. Extensive periventricular and subcortical white matter changes likely reflecting the sequela of small vessel ischemia. 3. Superimposed remote infarcts within the left thalamus, left lentiform nucleus, and periventricular white matter bilaterally. 4. Lacunar infarct versus dilated perivascular space within the inferior right temporal lobe.   Electronically Signed   By: Fidela Salisbury MD   On: 02/03/2020 14:58  Assessment & Plan by Problem: Active Problems:   Acute weakness  [name] [name] is a 69 y.o. F w/ hx CVA x 3 presenting with 2 days of new left-sided leg > arm weakness, slurred speech, and new facial droop concerning for acute CVA.  Acute left arm and left leg Weakness Patient presents with 2 days of new left-sided leg weakness greater than arm weakness and facial droop. Residual R-sided weakness present from previous strokes. Given signs and symptoms with significant history of stroke, acute CVA is highly likely. CT brain without contrast showed lacunar infarct vs. Dilated perivascular space in the inferior R temporal lobe with superimposed remote infarcts within the L thalamus, L lentiform nucleus, and periventricular white matter, bilaterally. Discussed with neurology who do not believe CT findings are consistent with acute stroke and recommend MRI brain for further evaluation. Patient takes Aggrenox at home BID.  - Patient given 325mg  ASA - Check MRI Brain wo contrast - OT evaluation  - Consult neurology if MRI brain concerning for new CVA, appreciate their recommendations  History of NIDDM Type II Chart review indicates patient takes Metformin 500mg  daily at home. Last hemoglobin A1c on file in 2019 was  13.3. Glucose 118. - Check HgbA1c  - Will hold glucose-lowering agents for now though will likely require SSI during admission - Check CMP and electrolytes   Chronic Kidney Disease Creatinine 1.52, BUN 27, GFR 40. Baseline creatinine in 2019 around 1.13. Likely due to uncontrolled HTN vs. Uncontrolled DM. Patient clinically appears euvolemic. Lower extremity swelling is likely due to limited mobility. Consider prerenal injury given recently poor PO intake.  - Check CMP, electrolytes - Monitor fluid status   New Macrocytic Anemia Hemoglobin 10.7, MCV 100.9. Patient has history of normocytic but not macrocytic anemia. Hgb Stable. Patient denies significant alcohol use. - Check B12 level and MMA levels  - Monitor morning CBC  Uncontrolled Hypertension  Blood pressure initially fairly well-controlled on arrival, though rising. Chart review shows patient is taking clonidine 0.1mg  weekly, amlodipine 10mg  daily, and Toprol-XL 50mg  daily though patient unsure of her medications.  - Holding antihypertensives for now, continue to monitor   HLD Patient is currently taking atorvastatin 10mg  nightly.  - Increase to high dose statin if tolerated    Arthritis Patient takes Tylenol at home. LFTs WNL. - Give tylenol 650mg  TID PRN for pain  Diet: Patient will require swallow study  VTE: Enoxaparin 40mg  SQ daily IVF: None Code: DNR  Dispo: Admit patient to Observation with expected length of stay less than 2 midnights.  Signed: Jeralyn Bennett, MD 02/04/2020, 7:00am Pager: [phone] On Call Pager 7p-7a: [phone]

## 2020-02-04 NOTE — Evaluation (Signed)
Physical Therapy Evaluation Patient Details Name: [name] [name] MRN: [id number] DOB: 03-12-1951 Today's Date: 02/04/2020   History of Present Illness  Pt is a 69 y.o. female admitted 02/03/20, sent in by [facility], for progressively worsening L-side weakness. MRI showed 14 mm acute ischemic nonhemorrhagic infarct involving posterior R frontal corona radiata. PMH includes multiple strokes (affecting R & L sides; w/c bound), DM.    Clinical Impression  Pt presents with an overall decrease in functional mobility secondary to above. PTA, pt reliant on lift equipment Clarise Cruz Plus) and wheelchair with assist from family and PCA for mobility, daily PCA assist for ADLs; pt participates with PT/OT services at [facility]. Today, pt required maxA for limited mobility. Pt with baseline BUE/BLE weakness, with BLE now <3/5 strength throguhout (pt reports weaker than baseline). Pt would benefit from continued acute PT services to maximize functional mobility and independence prior to d/c with SNF-level therapies. Pt declining SNF, plans to return home with continued family assist and [facility] services.     Follow Up Recommendations SNF;Supervision/Assistance - 24 hour (pt declined, return home with [facility] services)    Equipment Recommendations  None recommended by PT    Recommendations for Other Services       Precautions / Restrictions Precautions Precautions: Fall;Other (comment) Precaution Comments: BUE/BLE weakness secondary to previous and acute stroke Restrictions Weight Bearing Restrictions: No      Mobility  Bed Mobility Overal bed mobility: Needs Assistance Bed Mobility: Supine to Sit;Sit to Supine     Supine to sit: Max assist Sit to supine: Max assist   General bed mobility comments: Cues for movement initiation, maxA to manage LLE, pt assisting with RUE/RLE, maxA for trunk elevation and scooting hips to EOB; maxA for trunk and LE management return to supine; maxA for sliding up in  bed  Transfers                 General transfer comment: Deferred secondary to need for +2 assist and lift equipment not available this session  Ambulation/Gait                Stairs            Wheelchair Mobility    Modified Rankin (Stroke Patients Only)       Balance Overall balance assessment: Needs assistance   Sitting balance-Leahy Scale: Poor Sitting balance - Comments: Reliant on UE support and modA to maintain static sitting EOB without feet support (on ED stretcher)                                     Pertinent Vitals/Pain Pain Assessment: Faces Faces Pain Scale: Hurts a little bit Pain Location: L-side with mobility Pain Descriptors / Indicators: Discomfort Pain Intervention(s): Monitored during session    Home Living Family/patient expects to be discharged to:: Private residence Living Arrangements: Children;Other relatives Available Help at Discharge: Family;Personal care attendant;Available PRN/intermittently Type of Home: House Home Access: Level entry     Home Layout: One level Home Equipment: Bedside commode;Wheelchair - manual;Other (comment) (sara plus lift) Additional Comments: Lives with brother and son. Attends [facility] MWF via transport Lucianne Lei, receives PT/OT on Wednesdays    Prior Function Level of Independence: Needs assistance   Gait / Transfers Assistance Needed: Requires assist for all mobility. Reports use of sara plus for transfers with assist from family or aide. 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 PCA assists 7x/day with all other aspects of ADLs; wears Depends; gets washup at bed-level        Hand Dominance        Extremity/Trunk Assessment   Upper Extremity Assessment Upper Extremity Assessment: RUE deficits/detail;LUE deficits/detail RUE Deficits / Details: Not formally tested -  shoulder functionally <3/5, elbow and grip at least 3/5 bringing cup to mouth; pt reports "I could reach arm above head before coming here" RUE Coordination: decreased fine motor;decreased gross motor LUE Deficits / Details: Not formally assessed - functionally <3/5 throughout, difficulty gripping cup and bringing to mouth; pt R handed but typically uses L hand due to chronic RUE weakness, now LUE weaker LUE Coordination: decreased gross motor;decreased fine motor    Lower Extremity Assessment Lower Extremity Assessment: RLE deficits/detail;LLE deficits/detail RLE Deficits / Details: Knee flex/ext 3/5, ankle 3/5, hip abd/add 3/5 bed level, hip flex 3/5, hip ext <3/5 RLE Coordination: decreased gross motor;decreased fine motor LLE Deficits / Details: LLE <3/5 throughout       Communication   Communication: Expressive difficulties (soft spoken)  Cognition Arousal/Alertness: Awake/alert Behavior During Therapy: Flat affect Overall Cognitive Status: No family/caregiver present to determine baseline cognitive functioning                                 General Comments: Answering majority of questions (at times difficult historian) and following commands, apparent slowed processing requiring increased time. Initially reports she feels at baseline, but then recognizing increased L-side weakness with mobility attempts      General Comments General comments (skin integrity, edema, etc.): SpO2 100% on RA, HR 87, BP 174/96    Exercises     Assessment/Plan    PT Assessment Patient needs continued PT services  PT Problem List Decreased strength;Decreased range of motion;Decreased activity tolerance;Decreased balance;Decreased mobility;Decreased cognition;Decreased coordination;Decreased knowledge of use of DME;Obesity       PT Treatment Interventions DME instruction;Functional mobility training;Therapeutic activities;Therapeutic exercise;Balance training;Neuromuscular  re-education;Cognitive remediation;Patient/family education;Wheelchair mobility training    PT Goals (Current goals can be found in the Care Plan section)  Acute Rehab PT Goals Patient Stated Goal: Return home with assist from family and rehab services at [facility] PT Goal Formulation: With patient Time For Goal Achievement: 02/18/20 Potential to Achieve Goals: Fair    Frequency Min 3X/week   Barriers to discharge        Co-evaluation               AM-PAC PT "6 Clicks" Mobility  Outcome Measure Help needed turning from your back to your side while in a flat bed without using bedrails?: A Lot Help needed moving from lying on your back to sitting on the side of a flat bed without using bedrails?: A Lot Help needed moving to and from a bed to a chair (including a wheelchair)?: Total Help needed standing up from a chair using your arms (e.g., wheelchair or bedside chair)?: Total Help needed to walk in hospital room?: Total Help needed climbing 3-5 steps with a railing? : Total 6 Click Score: 8    End of Session   Activity Tolerance: Patient tolerated treatment well;Patient limited by fatigue Patient left: in bed;with call bell/phone within reach Nurse Communication: Mobility status;Need for lift equipment PT Visit Diagnosis: Other abnormalities of gait and mobility (R26.89);Muscle weakness (generalized) (M62.81);Hemiplegia and hemiparesis  Hemiplegia - Right/Left: Right Hemiplegia - caused by: Cerebral infarction    Time: 6943-7005 PT Time Calculation (min) (ACUTE ONLY): 26 min   Charges:   PT Evaluation $PT Eval Moderate Complexity: 1 Mod PT Treatments $Therapeutic Activity: 8-22 mins      Mabeline Caras, PT, DPT Acute Rehabilitation Services  Pager [phone] Office [phone]  Derry Lory 02/04/2020, 12:37 PM

## 2020-02-04 NOTE — Progress Notes (Signed)
Subjective:  [name] [name] is a 69 y.o. with PMH of CVA x3 w/ residual right-sided weakness, T2DM, HTN, HLD admit for left sided weakness on hospital day 0  [name] [name] was examined and evaluated at bedside this am. She was observed to have flat affect and intermittently non-interactive but she is AAOx3. She mentions having mild improvement in her symptoms since admission. Denies any worsening weakness or light-headedness.  Objective:  Vital signs in last 24 hours: Vitals:   02/04/20 0230 02/04/20 0245 02/04/20 0454 02/04/20 0900  BP: (!) 170/87 (!) 166/81 (!) 183/85   Pulse: 71 72 77 82  Resp: 11 17 16 20   Temp:   97.6 F (36.4 C)   TempSrc:   Oral   SpO2: 98% 99% 98% 100%  Weight:      Height:       Gen: Chronically ill-appearing, NAD HEENT: NCAT head, hearing intact, +strabismus Pulm: Breathing comfortably on room air, no cough, no distress  Extm: No peripheral edema Skin: Dry, Warm  Neurologic exam: Mental status: A&Ox3 Cranial Nerves:             II: PERRL             III, IV, VI: Extra-occular motions intact bilaterally             V, VII: L sided facial droop, sensation intact in all 3 divisions               VIII: hearing normal to rubbing fingers bilaterally               IX, X: palate rises symmetrically             XI: Head turn and shoulder shrug normal bilaterally               XII: tongue midline    Motor: Strength 3/5 on RUE, 3/5 on RLE, 4/5 on LUE, 2/5 on LLE, bulk muscle and tone are normal  Sensory: Light touch intact and symmetric bilaterally  Coordination: There is no dysmetria on finger-to-nose. Psych: Flat affect   Assessment/Plan:  Active Problems:   Acute weakness  [name] [name] is a 69 yo F w/ PMH of CVAx3 w/ residual right-sided weakness, T2Dm, HTN, HLD admit for acute CVA  Acute posterior right frontal corona radiata Presents w/ hx of multiple strokes in the past with repeat stroke this am. Found to have left sided weakness and stroke. At  home on atorvastatin 10mg  and Aggrenox at home for stroke prevention. Received aspirin 325mg  on admission. Per PCP's office, patient transitioning toward palliative care due to multiple strokes with residual deficits significantly impacting quality of life. Will provide supportive care and continue previous treatment and try to manage risk for recurrence. - Allow for permissive HTN in the setting of acute stroke - C/w aggrenox - Atorvastatin 80mg  - A1C, Lipid panel  - D/c tele - SLP eval - PT/OT  T2DM On Metformin 750mg  daily at home. Very low dose. Previously uncontrolled. - Check hgb a1c - SSI - Glucose checks  HTN Am bp 169/87. Will resume some of home anti-hypertensives. On clonidine, amlodipine, metoprolol at home. - Start 5mg  amlodipine - Resume home dose metoprolol - Not yet due for her clonidine dose  Depression Noted to have flat affect this am. Have not yet received her antidepressants since admission. - Resume home meds: Wellbutrin 300mg  daily  Glaucoma Uses Simbrinza (brimonidine-brinzolamide) at home. Not on formulary in the hospital - Use brimonidine TID, brinzolamide  TID  DVT prophx: lovenox Diet: Cardiac/diabetic Bowel: miralax Code: DNR   Prior to Admission Living Arrangement: home Anticipated Discharge Location: home vs snf Barriers to Discharge: medical work-up Dispo: Anticipated discharge in approximately 1-2 day(s).   Mosetta Anis, MD 02/04/2020, 1:48 PM Pager: [phone] After 5pm on weekdays and 1pm on weekends: On Call Pager: [phone]

## 2020-02-04 NOTE — ED Notes (Signed)
Transported to MRI

## 2020-02-04 NOTE — ED Provider Notes (Signed)
[facility]ERGENCY DEPARTMENT Provider Note   CSN: [id number] Arrival date & time: 02/03/20  1326     History Chief Complaint  Patient presents with  . Stroke Symptoms    [name] [name] is a 69 y.o. female.  HPI Patient presents for worsening left-sided weakness. Has a history of previous strokes with primary weakness on the right side but has had some left-sided weakness. She uses a wheelchair at baseline does not walk. On Wednesday she was fine but yesterday morning/Thursday she woke up with worsening left-sided weakness. Saw [facility], who sent her in for further evaluation. States normally she has much better use of that left side. No headache. No confusion. She is on Aggrenox. No fevers or chills.    Past Medical History:  Diagnosis Date  . Arthritis   . Diabetes mellitus without complication ([place])   . Stroke [facility])     Patient Active Problem List   Diagnosis Date Noted  . Acute weakness 02/04/2020  . Pressure injury of skin 11/19/2018  . Transient loss of consciousness 11/19/2018  . AKI (acute kidney injury) ([place]) 11/18/2018  . Uncontrolled diabetes mellitus ([place]) 10/18/2017  . Hypertension 10/18/2017  . History of stroke 10/18/2017    History reviewed. No pertinent surgical history.   OB History   No obstetric history on file.     History reviewed. No pertinent family history.  Social History   Tobacco Use  . Smoking status: Never Smoker  . Smokeless tobacco: Never Used  Substance Use Topics  . Alcohol use: Not Currently  . Drug use: Never    Home Medications Prior to Admission medications   Medication Sig Start Date End Date Taking? Authorizing Provider  acetaminophen (TYLENOL) 650 MG CR tablet Take 650 mg by mouth every 8 (eight) hours as needed for pain.    Yes [provider]  amLODipine (NORVASC) 10 MG tablet Take 10 mg by mouth daily. hypertension   Yes [provider]  atorvastatin (LIPITOR) 10 MG tablet  Take 10 mg by mouth at bedtime.    Yes [provider]  buPROPion (WELLBUTRIN XL) 300 MG 24 hr tablet Take 300 mg by mouth daily. depression   Yes [provider]  cloNIDine (CATAPRES - DOSED IN MG/24 HR) 0.1 mg/24hr patch Place 0.1 mg onto the skin every Sunday. hypertension   Yes [provider]  dipyridamole-aspirin (AGGRENOX) 200-25 MG 12hr capsule Take 1 capsule by mouth daily. Stroke prevention    Yes [provider]  gabapentin (NEURONTIN) 400 MG capsule Take 400 mg by mouth at bedtime.    Yes [provider]  Magnesium 250 MG TABS Take 250 mg by mouth daily.   Yes [provider]  metFORMIN (GLUCOPHAGE-XR) 500 MG 24 hr tablet Take 500 mg by mouth daily with breakfast.   Yes [provider]  metoprolol succinate (TOPROL-XL) 50 MG 24 hr tablet Take 50 mg by mouth daily. Take with or immediately following a meal.   Yes [provider]  nitroGLYCERIN (NITROSTAT) 0.4 MG SL tablet Place 0.4 mg under the tongue every 5 (five) minutes as needed for chest pain.   Yes [provider]  polyethylene glycol (MIRALAX / GLYCOLAX) packet Take 17 g by mouth daily as needed for mild constipation. Mix in 8 oz liquid and drink    Yes [provider]  potassium chloride SA (K-DUR,KLOR-CON) 20 MEQ tablet Take 20 mEq by mouth daily.    Yes [provider]  rOPINIRole (  REQUIP) 0.25 MG tablet Take 0.25 mg by mouth at bedtime.   Yes [provider]    Allergies    Accupril [quinapril hcl], Ciprofloxacin, and Salmon [fish allergy]  Review of Systems   Review of Systems  Constitutional: Negative for appetite change.  HENT: Negative for congestion.   Respiratory: Negative for shortness of breath.   Cardiovascular: Negative for chest pain.  Gastrointestinal: Negative for abdominal distention.  Genitourinary: Negative for flank pain.  Musculoskeletal: Negative for back pain.  Neurological: Positive for  weakness.  Psychiatric/Behavioral: Negative for confusion.    Physical Exam Updated Vital Signs BP 132/72 (BP Location: Right Arm)   Pulse 65   Temp (!) 97.5 F (36.4 C) (Oral)   Resp 18   Ht 5\' 7"  (1.702 m)   Wt 104 kg   SpO2 100%   BMI 35.91 kg/m   Physical Exam Vitals and nursing note reviewed.  HENT:     Head: Normocephalic.  Eyes:     Pupils: Pupils are equal, round, and reactive to light.     Comments: Left eye may have some mild lateral deviation but will focus straight ahead and will cross midline to left and right. Does appear to move conjugately with the right eye but at rest mild deviation.  Cardiovascular:     Rate and Rhythm: Regular rhythm.  Pulmonary:     Breath sounds: No wheezing or rhonchi.  Abdominal:     Tenderness: There is no abdominal tenderness.  Skin:    General: Skin is warm.     Capillary Refill: Capillary refill takes less than 2 seconds.  Neurological:     Mental Status: She is alert.     Comments: Bilateral weakness. Able to lift both arms somewhat but weaker on left than her baseline. Also decreased strength on left lower extremity. Baseline decreased strength on right side but states she is currently at her baseline. Face appears symmetric. awake and appropriate     ED Results / Procedures / Treatments   Labs (all labs ordered are listed, but only abnormal results are displayed) Labs Reviewed  CBC - Abnormal; Notable for the following components:      Result Value   RBC 3.48 (*)    Hemoglobin 10.7 (*)    HCT 35.1 (*)    MCV 100.9 (*)    All other components within normal limits  COMPREHENSIVE METABOLIC PANEL - Abnormal; Notable for the following components:   CO2 20 (*)    Glucose, Bld 118 (*)    BUN 27 (*)    Creatinine, Ser 1.52 (*)    GFR calc non Af Amer 35 (*)    GFR calc Af Amer 40 (*)    All other components within normal limits  COMPREHENSIVE METABOLIC PANEL - Abnormal; Notable for the following components:   CO2 19  (*)    BUN 28 (*)    Creatinine, Ser 1.37 (*)    GFR calc non Af Amer 40 (*)    GFR calc Af Amer 46 (*)    All other components within normal limits  LIPID PANEL - Abnormal; Notable for the following components:   HDL 31 (*)    All other components within normal limits  HEMOGLOBIN A1C - Abnormal; Notable for the following components:   Hgb A1c MFr Bld 6.4 (*)    All other components within normal limits  GLUCOSE, CAPILLARY - Abnormal; Notable for the following components:   Glucose-Capillary 159 (*)  All other components within normal limits  I-STAT CHEM 8, ED - Abnormal; Notable for the following components:   BUN 27 (*)    Creatinine, Ser 1.30 (*)    Glucose, Bld 113 (*)    TCO2 19 (*)    Hemoglobin 11.6 (*)    HCT 34.0 (*)    All other components within normal limits  SARS CORONAVIRUS 2 BY RT PCR (HOSPITAL ORDER, [facility])  PROTIME-INR  APTT  DIFFERENTIAL  HIV ANTIBODY (ROUTINE TESTING W REFLEX)  LACTIC ACID, PLASMA  MAGNESIUM  PHOSPHORUS  PROTIME-INR  TSH  VITAMIN B12  GLUCOSE, CAPILLARY  CBC WITH DIFFERENTIAL/PLATELET  CBC WITH DIFFERENTIAL/PLATELET  METHYLMALONIC ACID, SERUM  CBG MONITORING, ED    EKG EKG Interpretation  Date/Time:  Friday February 03 2020 13:40:46 EDT Ventricular Rate:  79 PR Interval:  164 QRS Duration: 82 QT Interval:  382 QTC Calculation: 438 R Axis:   -3 Text Interpretation: Normal sinus rhythm Minimal voltage criteria for LVH, may be normal variant ( R in aVL ) Cannot rule out Anterior infarct , age undetermined Abnormal ECG Confirmed by Davonna Belling [id number]) on 02/04/2020 12:52:32 AM   Radiology CT HEAD WO CONTRAST  Result Date: 02/03/2020 CLINICAL DATA:  Left-sided weakness, slurred speech EXAM: CT HEAD WITHOUT CONTRAST TECHNIQUE: Contiguous axial images were obtained from the base of the skull through the vertex without intravenous contrast. COMPARISON:  11/18/2018 FINDINGS: Brain: There is normal  anatomic configuration of the brain. There are extensive periventricular and subcortical white matter changes present likely reflecting the sequela of small vessel ischemia. Superimposed remote infarcts are noted within the left thalamus, left lentiform nucleus, and the periventricular white matter bilaterally. Lacunar infarct versus dilated perivascular space within the inferior right temporal lobe. No evidence of acute intracranial hemorrhage or infarct. No abnormal mass effect or midline shift. No abnormal intra or extra-axial mass lesion or fluid collection. Ventricular size is normal. Cerebellum unremarkable. Vascular: Extensive atherosclerotic calcification is seen within the distal vertebral arteries and carotid siphons bilaterally. No asymmetric hyperdense vasculature noted at the skull base. Skull: Intact Sinuses/Orbits: The paranasal sinuses are clear. The orbits are unremarkable Other: Mastoid air cells and middle ear cavities are clear. IMPRESSION: 1. No acute intracranial abnormality. 2. Extensive periventricular and subcortical white matter changes likely reflecting the sequela of small vessel ischemia. 3. Superimposed remote infarcts within the left thalamus, left lentiform nucleus, and periventricular white matter bilaterally. 4. Lacunar infarct versus dilated perivascular space within the inferior right temporal lobe. Electronically Signed   By: Fidela Salisbury MD   On: 02/03/2020 14:58   MR BRAIN WO CONTRAST  Result Date: 02/04/2020 CLINICAL DATA:  Initial evaluation for worsening left-sided weakness. History of prior stroke. EXAM: MRI HEAD WITHOUT CONTRAST TECHNIQUE: Multiplanar, multiecho pulse sequences of the brain and surrounding structures were obtained without intravenous contrast. COMPARISON:  Prior CT from 02/03/2020. FINDINGS: Brain: Generalized age-related cerebral atrophy. Patchy and confluent T2/FLAIR hyperintensity within the periventricular deep white matter both cerebral  hemispheres most consistent with chronic small vessel ischemic disease, moderate to advanced in nature. Multiple superimposed remote lacunar infarcts seen within the hemispheric cerebral white matter, basal ganglia, and thalami. Patchy involvement of the pons noted as well. 14 mm linear acute ischemic infarcts seen within the posterior right frontal corona radiata (series 8, image 48). No associated hemorrhage or mass effect. No other evidence for acute or subacute ischemia. Gray-white matter differentiation otherwise maintained. No acute intracranial hemorrhage. Few scattered punctate chronic  micro hemorrhages noted throughout the brain, most likely related to small vessel disease/poorly controlled hypertension. No mass lesion, midline shift or mass effect. No hydrocephalus or extra-axial fluid collection. Pituitary gland suprasellar region normal. Midline structures intact. Vascular: Major intracranial vascular flow voids maintained. Skull and upper cervical spine: Craniocervical junction within normal limits. Bone marrow signal intensity normal. No scalp soft tissue abnormality. Sinuses/Orbits: Patient status post ocular lens replacement on the left. Globes and orbital soft tissues demonstrate no acute finding. Paranasal sinuses are clear. Small left mastoid effusion noted, of doubtful significance. Inner ear structures grossly normal. Other: None. IMPRESSION: 1. 14 mm acute ischemic nonhemorrhagic infarct involving the posterior right frontal corona radiata. 2. Underlying age-related cerebral atrophy with advanced chronic microvascular ischemic disease, with multiple superimposed remote lacunar infarcts involving the hemispheric cerebral white matter, basal ganglia, thalami, and pons. Electronically Signed   By: Jeannine Boga M.D.   On: 02/04/2020 06:44    Procedures Procedures (including critical care time)  Medications Ordered in ED Medications  enoxaparin (LOVENOX) injection 40 mg (40 mg  Subcutaneous Not Given 02/04/20 1104)  atorvastatin (LIPITOR) tablet 80 mg (80 mg Oral Given 02/04/20 1538)  metoprolol succinate (TOPROL-XL) 24 hr tablet 50 mg (50 mg Oral Given 02/04/20 1536)  gabapentin (NEURONTIN) capsule 400 mg (400 mg Oral Given 02/04/20 2139)  buPROPion (WELLBUTRIN XL) 24 hr tablet 300 mg (300 mg Oral Given 02/04/20 1537)  insulin aspart (novoLOG) injection 0-15 Units (has no administration in time range)  brimonidine (ALPHAGAN) 0.2 % ophthalmic solution 1 drop (1 drop Both Eyes Not Given 02/04/20 2229)  brinzolamide (AZOPT) 1 % ophthalmic suspension 1 drop (1 drop Both Eyes Not Given 02/04/20 2229)  clopidogrel (PLAVIX) tablet 300 mg (300 mg Oral Given 02/04/20 1537)    Followed by  clopidogrel (PLAVIX) tablet 75 mg (has no administration in time range)  aspirin EC tablet 81 mg (81 mg Oral Given 02/04/20 1537)  lactated ringers bolus 500 mL (0 mLs Intravenous Stopped 02/04/20 1358)    ED Course  I have reviewed the triage vital signs and the nursing notes.  Pertinent labs & imaging results that were available during my care of the patient were reviewed by me and considered in my medical decision making (see chart for details).    MDM Rules/Calculators/A&P                          Patient with left-sided weakness for the last couple days.  Has chronic weakness and is in a wheelchair at baseline.  However now more weak from 2 days ago.  Head CT done and showed no acute intracranial abnormality.  However has worsening left-sided weakness.  Potentially could be new stroke.  Will be unable to do ADLs with the weakness that it is at this time.  Will the patient admitted to PCP. Not a TPA candidate due to time of onset of 2 days ago. Final Clinical Impres wheelchair sion(s) / ED Diagnoses Final diagnoses:  Weakness    Rx / DC Orders ED Discharge Orders    None       Davonna Belling, MD 02/05/20 [phone]

## 2020-02-04 NOTE — ED Notes (Addendum)
Three blood draw sticks with no success RN aware

## 2020-02-04 NOTE — Hospital Course (Addendum)
Tylenol ever 8 hours Aggraneux 1 capsule BID Clonidine 0.4m 24 hours film Gabapentin 4071m1 tablet qhsMagnesium oxide  Melatonin Metformin 75077m tablet once daily Metoprolol succinate 2m57mitrostat as needed  Simbriza 1 drop BID Wellbutryin XL 300mg70m------------------------------------------------------------------------------------------ Hospital course by problem list: 1.Left-sided weakness 2/2 right acute lacunar infarction. Presented with two day worsening left-sided arm and leg weakness along with a facial droop. Significant history of strokes in past with baseline residual right sides weakness, CT showed sequela of small vessel ischemia. Given 325 mg ASA. MRI w/o contrast confirmed this to be an acute ischemic nonhemorrhagic infarct involving the posterior right frontal corona radiata. DAPT initiated, replacing Aggrenox for ASA 81 mg + Plavix 75 mg for next 3 months, followed by ASA 81 mg alone. Transitioned toward palliative care per PCP office. Added atorvastatin 80 mg. Will discharge to [facility] team who will assess if functional needs can be safely met at home with their support.  2.Chronic Left Knee Pain. This is a chronic long-standing issue unfortunately. Found to have pain on 8/15, stating it was similar to episodes in past and to her arthritis. Tenderness present at joint line but no palpable effusion or warmth to suggest gout or septic arthritis. Given tylenol 1000 mg PRN and pain improved.   3.HTN. Past history of HTN, held home meds on admission to allow for permissive HTN in setting of acute stroke. Resumed anti-hypertensive agents, amlodipine 10 mg and metoprolol 24 hr 50 mg. Held clonidine and recommend management adjustments as needed per PCP.   4.T2DM. Came in on Metformin 500mg 3mr tablet daily at home. Previously uncontrolled, but HbA1c was improved at 6.4 this admission. Placed on SSI during stay, will transition back to home management with metformin 500 mg 24 hr  tablet.

## 2020-02-04 NOTE — ED Notes (Signed)
Admitting MD at the bedside.  

## 2020-02-04 NOTE — ED Notes (Signed)
Patient remains in MRI 

## 2020-02-05 LAB — GLUCOSE, CAPILLARY
Glucose-Capillary: 96 mg/dL (ref 70–99)
Glucose-Capillary: 143 mg/dL — ABNORMAL HIGH (ref 70–99)
Glucose-Capillary: 199 mg/dL — ABNORMAL HIGH (ref 70–99)
Glucose-Capillary: 103 mg/dL — ABNORMAL HIGH (ref 70–99)

## 2020-02-05 LAB — HEMOGLOBIN A1C
Hgb A1c MFr Bld: 6.4 % — ABNORMAL HIGH (ref 4.8–5.6)
Mean Plasma Glucose: 136.98 mg/dL

## 2020-02-05 LAB — LIPID PANEL
Cholesterol: 108 mg/dL (ref 0–200)
HDL: 31 mg/dL — ABNORMAL LOW (ref >40)
LDL Cholesterol: 60 mg/dL (ref 0–99)
Total CHOL/HDL Ratio: 3.5 RATIO
Triglycerides: 85 mg/dL (ref <150)
VLDL: 17 mg/dL (ref 0–40)

## 2020-02-05 MED ORDER — AMLODIPINE BESYLATE 10 MG PO TABS
10.0000 mg | ORAL_TABLET | Freq: Every day | ORAL | Status: DC
  Administered 2020-02-05 – 2020-02-06 (×2): 10 mg via ORAL
  Filled 2020-02-05 (×2): qty 1

## 2020-02-05 MED ORDER — ACETAMINOPHEN 500 MG PO TABS
1000.0000 mg | ORAL_TABLET | Freq: Four times a day (QID) | ORAL | Status: DC | PRN
  Administered 2020-02-05: 1000 mg via ORAL
  Filled 2020-02-05: qty 2

## 2020-02-05 NOTE — Evaluation (Signed)
Clinical/Bedside Swallow Evaluation Patient Details  Name: [name] [name] MRN: [id number] Date of Birth: Feb 10, 1951  Today's Date: 02/05/2020 Time: SLP Start Time (ACUTE ONLY): 0850 SLP Stop Time (ACUTE ONLY): 0911 SLP Time Calculation (min) (ACUTE ONLY): 21 min  Past Medical History:  Past Medical History:  Diagnosis Date  . Arthritis   . Diabetes mellitus without complication ([place])   . Stroke [facility])    Past Surgical History: History reviewed. No pertinent surgical history. HPI:  Pt is a 69 y.o. female admitted 02/03/20, sent in by [facility], for progressively worsening L-side weakness. MRI showed 14 mm acute ischemic nonhemorrhagic infarct involving posterior R frontal corona radiata. PMH includes multiple strokes (affecting R & L sides; w/c bound), DM.    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  Pt was encountered awake/alert and she was observed to have low vocal intensity at baseline.   Pt reported that she has top and bottom dentures at home, but stated that she is able to masticate solids effectively without her dentures.  She consumed trials of thin liquid, puree, and regular solids.  Mastication of regular solids was prolonged, but it was effective and no oral residue was observed following swallow initiation.  Pt was noted to have difficulty feeding herself secondary to BUE weakness.  AP transport and swallow initiation appeared to be timely, and no clinical s/sx of aspiration were observed with any trials.  Recommend diet change to Dysphagia 3 (soft) solids and thin liquids.  Pt will benefit from assistance during meals.  She was oriented x4 and denied acute cognitive-linguistic changes; however, a full cognitive-linguistic evaluation may be beneficial given acute infarct.  No further  skilled ST is warranted at this time; therefore will sign off.  Please re-consult or place an order for a cognitive-linguistic evaluation if it aligns with MD plan of care.   SLP Visit Diagnosis: Dysphagia, oral phase (R13.11)    Aspiration Risk  Mild aspiration risk    Diet Recommendation Dysphagia 3 (Mech soft);Thin liquid   Liquid Administration via: Straw;Cup Medication Administration: Whole meds with liquid Supervision: Staff to assist with self feeding Compensations: Slow rate;Small sips/bites Postural Changes: Seated upright at 90 degrees    Other  Recommendations Oral Care Recommendations: Oral care BID   Follow up Recommendations None        Swallow Study   General HPI: Pt is a 69 y.o. female admitted 02/03/20, sent in by [facility], for progressively worsening L-side weakness. MRI showed 14 mm acute ischemic nonhemorrhagic infarct involving posterior R frontal corona radiata. PMH includes multiple strokes (affecting R & L sides; w/c bound), DM.  Type of Study: Bedside Swallow Evaluation Previous Swallow Assessment: None  Diet Prior to this Study: Regular;Thin liquids Temperature Spikes Noted: No Respiratory Status: Room air History of Recent Intubation: No Behavior/Cognition: Alert;Cooperative;Pleasant mood Oral Cavity Assessment: Within Functional Limits Oral Care Completed by SLP: No Oral Cavity - Dentition: Edentulous (dentures at home ) Vision: Functional for self-feeding Self-Feeding Abilities: Needs assist Patient Positioning: Upright in bed Baseline Vocal Quality: Low vocal intensity Volitional Swallow: Able to elicit    Oral/Motor/Sensory Function Overall Oral Motor/Sensory Function: Within functional limits   Ice Chips Ice chips: Not tested   Thin Liquid Thin Liquid: Within functional limits Presentation: Straw    Nectar Thick Nectar Thick Liquid: Not tested  Honey Thick Honey Thick Liquid: Not tested   Puree Puree: Within functional  limits Presentation: Spoon   Solid     Solid: Within functional limits Presentation: Self Fed Oral Phase Functional Implications: Impaired mastication     Colin Mulders., M.S., [place] Office: [phone]  Elvia Collum Nicko Daher 02/05/2020,9:25 AM

## 2020-02-05 NOTE — Progress Notes (Signed)
   Subjective:  [name] [name] is a 69 y.o. with PMH of CVA x3 w/ residual right-sided weakness, T2DM, HTN, HLD admit for left sided weakness on hospital day 1  [name] [name] was examined and evaluated at bedside this am. Had prolonged discussion with [name] [name] regarding her diagnosis of acute stroke and PT recommendation yesterday to be discharged to SNF. She was noted to be tearful and mentions that she would like to go to home if possible. Discussed her worsening neurological deficits and safety at home. She states she will think about it while getting evaluated with OT and speech. Meanwhile, complaining of L knee pain which is similar to her chronic arthritic pain at home.  Objective:  Vital signs in last 24 hours: Vitals:   02/04/20 1954 02/04/20 2312 02/05/20 0329 02/05/20 0838  BP: (!) 175/89 (!) 151/78 132/72 (!) 163/73  Pulse: 78 69 65 70  Resp: 17 17 18 18   Temp:  97.6 F (36.4 C) (!) 97.5 F (36.4 C) 97.9 F (36.6 C)  TempSrc:  Oral Oral Oral  SpO2: 100% 100% 100% 98%  Weight:      Height:       Gen: Chronically ill-appearing, tearful HEENT: NCAT head, hearing intact, +strabismus Pulm: Breathing comfortably on room air  Extm: No peripheral edema, L knee tenderness to palpation along joint line. No edema, palpable effusion or warmth Skin: Dry, Warm  Neurologic exam: Mental status: A&Ox3 Cranial nerves: R facial droop Motor: Strength 3/5 on RUE, 3/5 on RLE, 4/5 on LUE, 2/5 on LLE,   Assessment/Plan:  Active Problems:   Acute weakness  [name] [name] is a 69 yo F w/ PMH of CVAx3 w/ residual right-sided weakness, T2Dm, HTN, HLD admit for acute CVA  Acute posterior right frontal corona radiata Presents w/ hx of multiple strokes in the past with repeat stroke this am. Found to have left sided weakness. Lipid panel shows meeting ldl goal @ 60. Hemoglobin a1c at 6.4. Recurrent stroke possibly due to inadequate anti-platelet therapy while on Aggrenox at home. Will c/w w/ DAPT w/  aspirin and plavix. PT recommends SNF for discharge but pt wants to go home with [facility]. Will f/u OT/Slp evaluation but will need to discuss case with [facility] physician on Monday to assess if they can adequately meet her needs - Allow for permissive HTN in the setting of acute stroke - C/w DAPT - Atorvastatin 80mg  - F/u OT/SLP eval  Chronic L knee pain Complaining of L knee pain. Mentions having arthritis and feels similar to previous episodes. Tenderness at joint line. No palpable effusion or warmth to suggest gout or septic arthritis.  - Start tylenol 1000mg  PRn  T2DM On Metformin 750mg  daily at home. Very low dose. Previously uncontrolled but improved to 6.4 this admission. - SSI - Glucose checks  HTN Am bp 163/73. Will resume some of home anti-hypertensives to normalize blood pressure slowly. On clonidine, amlodipine, metoprolol at home. - Resume home dose amlodipine - Resume home dose metoprolol - Not yet due for her clonidine dose  DVT prophx: lovenox Diet: Cardiac/diabetic Bowel: miralax Code: DNR   Prior to Admission Living Arrangement: home Anticipated Discharge Location: home vs snf Barriers to Discharge: medical work-up Dispo: Anticipated discharge in approximately 1-2 day(s).   Mosetta Anis, MD 02/05/2020, 9:45 AM Pager: [phone] After 5pm on weekdays and 1pm on weekends: On Call Pager: [phone]

## 2020-02-05 NOTE — Progress Notes (Signed)
OT Cancellation Note  Patient Details Name: [name] [name] MRN: [id number] DOB: 01/21/1951   Cancelled Treatment:       Baird Lyons 02/05/2020, 5:59 PM  Maurie Boettcher, OT/L   Acute OT Clinical Specialist Acute Rehabilitation Services Pager [phone] Office [phone]

## 2020-02-05 NOTE — Consult Note (Signed)
WOC Nurse Consult Note: Reason for Consult: Area of white, macerated tissue at the apex of the gluteal cleft. NOT pressure related Wound type: Moisture associated skin damage Pressure Injury POA: N/A Measurement:3cm x 2cm with no depth Wound [id number], white macerated tissue Drainage (amount, consistency, odor) None Periwound:intact Dressing procedure/placement/frequency: Patient is being turned side to side, a sacral prophylactic foam dressing covers the affected area. I will provide a pressure redistribution chair cushion and bilateral heel boots for PI prevention.  [facility] nursing team will not follow, but will remain available to this patient, the nursing and medical teams.  Please re-consult if needed. Thanks, Maudie Flakes, MSN, RN, [place], Arther Abbott  Pager# [phone]

## 2020-02-06 DIAGNOSIS — I6381 Other cerebral infarction due to occlusion or stenosis of small artery: Principal | ICD-10-CM

## 2020-02-06 DIAGNOSIS — E7800 Pure hypercholesterolemia, unspecified: Secondary | ICD-10-CM

## 2020-02-06 DIAGNOSIS — M25562 Pain in left knee: Secondary | ICD-10-CM

## 2020-02-06 LAB — CBC
HCT: 32.2 % — ABNORMAL LOW (ref 36.0–46.0)
Hemoglobin: 10.2 g/dL — ABNORMAL LOW (ref 12.0–15.0)
MCH: 30.8 pg (ref 26.0–34.0)
MCHC: 31.7 g/dL (ref 30.0–36.0)
MCV: 97.3 fL (ref 80.0–100.0)
Platelets: 206 10*3/uL (ref 150–400)
RBC: 3.31 MIL/uL — ABNORMAL LOW (ref 3.87–5.11)
RDW: 12.5 % (ref 11.5–15.5)
WBC: 4.6 10*3/uL (ref 4.0–10.5)
nRBC: 0 % (ref 0.0–0.2)

## 2020-02-06 LAB — BASIC METABOLIC PANEL
Anion gap: 8 (ref 5–15)
BUN: 15 mg/dL (ref 8–23)
CO2: 23 mmol/L (ref 22–32)
Calcium: 9.2 mg/dL (ref 8.9–10.3)
Chloride: 108 mmol/L (ref 98–111)
Creatinine, Ser: 1.11 mg/dL — ABNORMAL HIGH (ref 0.44–1.00)
GFR calc Af Amer: 59 mL/min — ABNORMAL LOW (ref >60)
GFR calc non Af Amer: 51 mL/min — ABNORMAL LOW (ref >60)
Glucose, Bld: 103 mg/dL — ABNORMAL HIGH (ref 70–99)
Potassium: 4.2 mmol/L (ref 3.5–5.1)
Sodium: 139 mmol/L (ref 135–145)

## 2020-02-06 LAB — GLUCOSE, CAPILLARY
Glucose-Capillary: 126 mg/dL — ABNORMAL HIGH (ref 70–99)
Glucose-Capillary: 136 mg/dL — ABNORMAL HIGH (ref 70–99)
Glucose-Capillary: 136 mg/dL — ABNORMAL HIGH (ref 70–99)

## 2020-02-06 MED ORDER — ATORVASTATIN CALCIUM 80 MG PO TABS: 80.0000 mg | ORAL_TABLET | Freq: Every day | ORAL | 0 refills | Status: AC

## 2020-02-06 MED ORDER — CLOPIDOGREL BISULFATE 75 MG PO TABS: 75.0000 mg | ORAL_TABLET | Freq: Every day | ORAL | 0 refills | Status: AC

## 2020-02-06 MED ORDER — ASPIRIN 81 MG PO TBEC: 81.0000 mg | DELAYED_RELEASE_TABLET | Freq: Every day | ORAL | 11 refills | Status: AC

## 2020-02-06 NOTE — Progress Notes (Signed)
   Subjective: [name] [name] is a 69 y.o. with PMH of CVA x3 w/ residual right-sided weakness, T2DM, HTN, HLD admit for left-sided weakness on hospital day 2.  Patient states that she is feeling okay right now, doesn't remember much. Reports that she has had no new changes and that she did see the SLP and PT yesterday. Reports that she was able to eat food by herself today. States they also added boots for "pressure". She stated that she is still planning for home placement with [facility].   Objective:  Vital signs in last 24 hours: Vitals:   02/06/20 0000 02/06/20 0400 02/06/20 0500 02/06/20 0741  BP: 136/70 138/72  135/65  Pulse:    69  Resp: _0 Temp: 98.1 F (36.7 C) 98.2 F (36.8 C)  98.3 F (36.8 C)  TempSrc: Oral Oral  Oral  SpO2:    98%  Weight:   93.1 kg   Height:       Weight change:  No intake or output data in the 24 hours ending 02/06/20 1008 Physical Exam: GEN: NAD, lying in bed CV: RRR, no murmurs, rubs, or gallops PULM: CTA B, no wheezing, rhonchi or crackles ABD: soft, NT/ND, +BS EXT: Prevalon boots present bilaterally  PSYCH: Flat affect NEURO: CN's=Right facial droop Motor=Strength 3/5 on LUE, 4/5 on RUE, 1/5 on LLE, and 3/5 on RLE  Sensory=Sensations intact in bilateral lower extremities   Assessment/Plan: [name] [name] is a 69 yo F w/ PMH of CVAx3 w/ residual right-sided weakness, T2DM, HTN, HLD admit for left-sided weakness 2/2 an acute lacunar stroke.   Active Problems:   Acute weakness Left-sided weakness 2/2 acute lacunar infarction. Patient presented with worsening left arm and leg weakness, confirmed on CT & MRI to have an ischemic infarction of the posterior right frontal corona radiata. History of multiple strokes in past and suspect this presentation to be due to inadequate anti-plt therapy while on Aggrenox at home. SLP put patient on on dysphagia 3 (soft) solid & thin liquids diet and would benefit from assistance during meals, appreciate  their assistance. Team at this time recommends temporary SNF given her presentation, PT and OT also agreed. Will talk again to patient and her family, if she still wishes to go home we can discharge [facility] outpatient who will then evaluate her to see if her functional needs can be safely met in the home with [facility] support.  Plan:  --Pending placement following discharge  --Allow for permissive HTN in the setting of acute stroke --Continue DAPT of ASA 81 mg + Plavix 75 mg for 3 months, then ASA 81 mg alone after  --Continue Atorvastatin 52m   Chronic L knee pain. Complained of pain on previous visits, today had no complaints. Tolerated tylenol 1000 mg PRN well, can continue.  Plan: --Continue tylenol 1000 mg PRN  T2DM. On Metformin 5040m24 hr tablet daily at home. Previously uncontrolled, but HbA1c improved to 6.4 this admission.  Plan: --SSI --Regular glucose checks  HTN. AM BP was 138/79. Resumed some of home anti-hypertensives to normalize BP slowly. Plan:  --Continue amlodipine 10 mg --Continue metoprolol 24 hr 50 mg --Holding clonidine 0.1 mg/24 hr patch      LOS: 2 days   AcLeory PlowmanMedical Student 02/06/2020, 10:08 AM Pager: [phone]

## 2020-02-06 NOTE — TOC Transition Note (Signed)
Transition of Care [facility]) - CM/SW Discharge Note   Patient Details  Name: [name] [name] MRN: [id number] Date of Birth: Sep 23, 1950  [facility]) CM/SW Contact:  Geralynn Ochs, LCSW Phone Number: 02/06/2020, 2:58 PM   Clinical Narrative:   CSW met with patient to discuss discharge recommendations, but patient refusing SNF. Wants to return home. CSW discussed with patient's [facility] Social worker, Lovena Le, and they agree, they feel she is not too far from her baseline. [facility] will provide transport for patient back home, and family is aware and waiting for her. [facility] will continue aide support and center-based therapies for the patient. No other needs at this time.    Final next level of care: Home/Self Care Barriers to Discharge: Barriers Resolved   Patient Goals and CMS Choice Patient states their goals for this hospitalization and ongoing recovery are:: to get back home CMS Medicare.gov Compare Post Acute Care list provided to:: Patient Choice offered to / list presented to : Patient, Adult Children  Discharge Placement                Patient to be transferred to facility by: [facility] Name of family member notified: Nikki Patient and family notified of of transfer: 02/06/20  Discharge Plan and Services                                     Social Determinants of Health (SDOH) Interventions     Readmission Risk Interventions No flowsheet data found.

## 2020-02-06 NOTE — Discharge Summary (Addendum)
Name: [name] [name] MRN: [id number] DOB: [birth year] 69 y.o. PCP: Janifer Adie, MD  Date of Admission: 02/03/2020  1:29 PM Date of Discharge: 8/16/202108/16/2021 Attending Physician: No att. providers found  Discharge Diagnosis: 1. Acute posterior right frontal corona radiata CVA 2. T2 diabetes mellitus 3. Hypertension  Discharge Medications: Allergies as of 02/06/2020      Reactions   Accupril [quinapril Hcl] Other (See Comments)   "Allergic," per [facility]   Ciprofloxacin Hives   Salmon [fish Allergy] Other (See Comments)   "Allergic," per [facility]      Medication List    STOP taking these medications   Aggrenox 200-25 MG 12hr capsule Generic drug: dipyridamole-aspirin   cloNIDine 0.1 mg/24hr patch Commonly known as: CATAPRES - Dosed in mg/24 hr     TAKE these medications   acetaminophen 650 MG CR tablet Commonly known as: TYLENOL Take 650 mg by mouth every 8 (eight) hours as needed for pain.   amLODipine 10 MG tablet Commonly known as: NORVASC Take 10 mg by mouth daily. hypertension   aspirin 81 MG EC tablet Take 1 tablet (81 mg total) by mouth daily. Swallow whole.   atorvastatin 80 MG tablet Commonly known as: LIPITOR Take 1 tablet (80 mg total) by mouth daily. What changed:   medication strength  how much to take  when to take this   buPROPion 300 MG 24 hr tablet Commonly known as: WELLBUTRIN XL Take 300 mg by mouth daily. depression   clopidogrel 75 MG tablet Commonly known as: PLAVIX Take 1 tablet (75 mg total) by mouth daily.   gabapentin 400 MG capsule Commonly known as: NEURONTIN Take 400 mg by mouth at bedtime.   Magnesium 250 MG Tabs Take 250 mg by mouth daily.   metFORMIN 500 MG 24 hr tablet Commonly known as: GLUCOPHAGE-XR Take 500 mg by mouth daily with breakfast.   metoprolol succinate 50 MG 24 hr tablet Commonly known as: TOPROL-XL Take 50 mg by mouth daily. Take with or immediately following a  meal.   nitroGLYCERIN 0.4 MG SL tablet Commonly known as: NITROSTAT Place 0.4 mg under the tongue every 5 (five) minutes as needed for chest pain.   polyethylene glycol 17 g packet Commonly known as: MIRALAX / GLYCOLAX Take 17 g by mouth daily as needed for mild constipation. Mix in 8 oz liquid and drink   potassium chloride SA 20 MEQ tablet Commonly known as: KLOR-CON Take 20 mEq by mouth daily.   rOPINIRole 0.25 MG tablet Commonly known as: REQUIP Take 0.25 mg by mouth at bedtime.       Disposition and follow-up:   Ms.Mykaila Avis was discharged from [facility] in Stable condition.  At the hospital follow up visit please address:  1.  Patient presented with acute R frontal corona radiata stroke. Started patient on new DAPT therapy (ASA + Plavix), d/c Aggrenox. Clonidine held during hospitalization. Consider restarting  2.  Labs / imaging needed at time of follow-up: BMP, CBC  3.  Pending labs/ test needing follow-up: n/a  Follow-up Appointments:  Follow-up Information    Janifer Adie, MD. Schedule an appointment as soon as possible for a visit in 1 week(s).   Specialty: Family Medicine Contact information: [address] [place] [address] [phone]             Dr. Bradd Burner, [facility] [place] - Schedule a visit in 1 week  Hospital Course by problem list: 1. 1.Left-sided  weakness 2/2 right acute lacunar infarction. Presented with two day worsening left-sided arm and leg weakness along with a facial droop. Significant history of strokes in past with baseline residual right sides weakness, CT showed sequela of small vessel ischemia. Given 325 mg ASA. MRI w/o contrast confirmed this to be an acute ischemic nonhemorrhagic infarct involving the posterior right frontal corona radiata. DAPT initiated, replacing Aggrenox for ASA 81 mg + Plavix 75 mg for next 3 months, followed by ASA 81 mg alone. Transitioned toward palliative care per PCP office. Added  atorvastatin 80 mg. Will discharge to [facility] team who will assess if functional needs can be safely met at home with their support.  2.Chronic Left Knee Pain. This is a chronic long-standing issue unfortunately. Found to have pain on 8/15, stating it was similar to episodes in past and to her arthritis. Tenderness present at joint line but no palpable effusion or warmth to suggest gout or septic arthritis. Given tylenol 1000 mg PRN and pain improved.   3.HTN. Past history of HTN, held home meds on admission to allow for permissive HTN in setting of acute stroke. Resumed anti-hypertensive agents, amlodipine 10 mg and metoprolol 24 hr 50 mg. Held clonidine and recommend management adjustments as needed per PCP.    4.T2DM. Came in on Metformin 563m 24 hr tablet daily at home. Previously uncontrolled, but HbA1c was improved at 6.4 this admission. Placed on SSI during stay, will transition back to home management with metformin 500 mg 24 hr tablet.   Discharge Vitals:   BP 136/73 (BP Location: Left Leg)   Pulse 70   Temp 98.6 F (37 C) (Oral)   Resp 14   Ht '5\' 7"'  (1.702 m)   Wt 93.1 kg   SpO2 99%   BMI 32.15 kg/m   Pertinent Labs, Studies, and Procedures:  CBC Latest Ref Rng & Units 02/06/2020 02/03/2020 02/03/2020  WBC 4.0 - 10.5 K/uL 4.6 - 5.6  Hemoglobin 12.0 - 15.0 g/dL 10.2(L) 11.6(L) 10.7(L)  Hematocrit 36 - 46 % 32.2(L) 34.0(L) 35.1(L)  Platelets 150 - 400 K/uL 206 - 219   BMP Latest Ref Rng & Units 02/06/2020 02/04/2020 02/03/2020  Glucose 70 - 99 mg/dL 103(H) 86 113(H)  BUN 8 - 23 mg/dL 15 28(H) 27(H)  Creatinine 0.44 - 1.00 mg/dL 1.11(H) 1.37(H) 1.30(H)  Sodium 135 - 145 mmol/L 139 140 142  Potassium 3.5 - 5.1 mmol/L 4.2 4.5 4.1  Chloride 98 - 111 mmol/L 108 108 108  CO2 22 - 32 mmol/L 23 19(L) -  Calcium 8.9 - 10.3 mg/dL 9.2 9.6 -   Lipid Panel     Component Value Date/Time   CHOL 108 02/05/2020 0319   TRIG 85 02/05/2020 0319   HDL 31 (L) 02/05/2020 0319   CHOLHDL 3.5  02/05/2020 0319   VLDL 17 02/05/2020 0319   LDLCALC 60 02/05/2020 0319   MRI Brain 02/04/20 IMPRESSION: 1. 14 mm acute ischemic nonhemorrhagic infarct involving the posterior right frontal corona radiata. 2. Underlying age-related cerebral atrophy with advanced chronic microvascular ischemic disease, with multiple superimposed remote lacunar infarcts involving the hemispheric cerebral white matter, basal ganglia, thalami, and pons.  Discharge Instructions: Discharge Instructions    Call MD for:  difficulty breathing, headache or visual disturbances   Complete by: As directed    Call MD for:  extreme fatigue   Complete by: As directed    Call MD for:  hives   Complete by: As directed    Call MD for:  persistant dizziness or light-headedness   Complete by: As directed    Call MD for:  persistant nausea and vomiting   Complete by: As directed    Call MD for:  redness, tenderness, or signs of infection (pain, swelling, redness, odor or green/yellow discharge around incision site)   Complete by: As directed    Call MD for:  severe uncontrolled pain   Complete by: As directed    Call MD for:  temperature >100.4   Complete by: As directed    Diet - low sodium heart healthy   Complete by: As directed    Discharge instructions   Complete by: As directed    Ms. [place],  I am glad you are feeling better. You were admitted for a stroke that occurred on the right side of your brain, which caused weakness in your left arm and leg. This was likely due to chronic vessel changes that have occurred over time. It is important to decrease the risk of further strokes by controlling your risk factors. These risk factors include hypertension, high cholesterol, and diabetes. Please see the following notes:  -You have been taking Aggrenox as a blood thinner for your prior strokes. This pill has two different anti-platelet medications in it. You should stop taking the Aggrenox, as we are switching you to  a different medication regimen: -Aspirin 43m, once daily -Clopidogrel (PLAVIX) 751m once daily -You will take these two medications for three months (until 06/05/20). At that point, you will stop taking the clopidogrel, and just take aspirin once daily.  -For your cholesterol, we increased the dose of your medication. You will now take atorvastatin (LIPITOR) 8058monce daily.  -For your diabetes, continue taking metformin 500m66mily.  -For your hypertension, continue taking amlodipine 10mg61mly and metoprolol 50mg 42my. We recommend stop using the clonidine patch, as it can increase your blood pressure if not taken regularly.  -Please follow-up with your doctor at [facility] might need to adjust these medications as well.  -It is also important to work with physical therapy. They will help you re-gain some strength in your legs and arms.  -It was a pleasure meeting you, Ms. Cappuccio. We wish you the best and hope you stay happy and healthy.  Thank you, PhilliSanjuan Dameuis ALeory Plowman Increase activity slowly   Complete by: As directed      Ms. Mara,  I am glad you are feeling better. You were admitted for a stroke that occurred on the right side of your brain, which caused weakness in your left arm and leg. This was likely due to chronic vessel changes that have occurred over time. It is important to decrease the risk of further strokes by controlling your risk factors. These risk factors include hypertension, high cholesterol, and diabetes. Please see the following notes:  -You have been taking Aggrenox as a blood thinner for your prior strokes. This pill has two different anti-platelet medications in it. You should stop taking the Aggrenox, as we are switching you to a different medication regimen: -Aspirin 81mg, 76m daily -Clopidogrel (PLAVIX) 75mg, o60mdaily -You will take these two medications for three months (until 06/05/20). At that point, you will stop taking  the clopidogrel, and just take aspirin once daily.  -For your cholesterol, we increased the dose of your medication. You will now take atorvastatin (LIPITOR) 80mg, on49maily.  -For your diabetes, continue taking metformin 500mg dail58m-For your hypertension, continue taking amlodipine  47m daily and metoprolol 561mdaily. We recommend stop using the clonidine patch, as it can increase your blood pressure if not taken regularly.  -[facility] [place] might need to adjust these medications as well.  -It is also important to work with physical therapy. They will help you re-gain some strength in your legs and arms.  -It was a pleasure meeting you, Ms. Baack. We wish you the best and hope you stay happy and healthy.  Thank you, PhSanjuan DameMD LuLeory PlowmanMSIndian River EstatesSigned: LeMosetta AnisMD 02/07/2020, 7:35 AM   Pager: [phone]

## 2020-02-06 NOTE — Progress Notes (Signed)
Patient has been discharged from the unit. Tele disconnected and IVs are removed. Family is aware. All personal belongings are sent with the patient. Pt reports of no pain. AVS documentation has been sent to receiving party. Pt sent in good spirits.

## 2020-02-06 NOTE — Evaluation (Addendum)
Occupational Therapy Evaluation Patient Details Name: [name] [name] MRN: [id number] DOB: [birth year] Today's Date: 02/06/2020    History of Present Illness Pt is a 69 y.o. female admitted 02/03/20, sent in by [facility], for progressively worsening L-side weakness. MRI showed 14 mm acute ischemic nonhemorrhagic infarct involving posterior R frontal corona radiata. PMH includes multiple strokes (affecting R & L sides; w/c bound), DM.   Clinical Impression   PTA patient living at home with her brother and son, requiring assist for ADLs from bed level (or showering at roll in shower at [facility]) but completing grooming and self feeding without assist, reports using sit to stand lift for [facility] transfers and depends toileting needs; w/c level at baseline.  Patient admitted for above and limited by problem list below, including impaired cognition, L sided proximal tone and increased weakness, impaired vision, decreased activity tolerance.  She presents with flat affect, requires increased time for processing and initiation for simple commands, and decreased awareness (soiled linen upon entry from purewick leaking and pt denies 'wet bed').  Patient currently requires min assist for grooming tasks, mod-max assist for UB ADLs and total assist +2 for LB ADLs; max assist +2 for rolling in bed.  She will benefit from further OT services while admitted and after dc at SNF level to optimize return to PLOF and decrease burden of care upon return home. If patient declines SNF, will continue to need maximal [place] services, aide and [facility] services.     Follow Up Recommendations  SNF;Supervision/Assistance - 24 hour    Equipment Recommendations  None recommended by OT    Recommendations for Other Services       Precautions / Restrictions Precautions Precautions: Fall;Other (comment) Precaution Comments: BUE/BLE weakness secondary to previous and acute stroke Restrictions Weight Bearing Restrictions: No       Mobility Bed Mobility Overal bed mobility: Needs Assistance Bed Mobility: Rolling Rolling: Max assist;+2 for physical assistance;+2 for safety/equipment         General bed mobility comments: max assist +2 to roll for hygiene and linen change, cueing for technique and pt able to assist minimally with UE using rails    Transfers                 General transfer comment: deferred     Balance                                           ADL either performed or assessed with clinical judgement   ADL Overall ADL's : Needs assistance/impaired Eating/Feeding: Set up;Bed level Eating/Feeding Details (indicate cue type and reason): reports eating this morning, reports "harder then normal" starting with her L hand but finishing with her R hand  Grooming: Minimal assistance;Bed level;Wash/dry hands;Wash/dry face Grooming Details (indicate cue type and reason): for thoroughness and ability to bring washcloth up towards forehead Upper Body Bathing: Moderate assistance;Bed level   Lower Body Bathing: Total assistance;+2 for physical assistance;Bed level   Upper Body Dressing : Maximal assistance;Bed level   Lower Body Dressing: Total assistance;+2 for physical assistance;Bed level     Toilet Transfer Details (indicate cue type and reason): deferred  Toileting- Clothing Manipulation and Hygiene: Total assistance;+2 for physical assistance;Bed level Toileting - Clothing Manipulation Details (indicate cue type and reason): soiled upon entry, total assist +2 for hygiene and linen change bed level  Functional mobility during ADLs: Maximal assistance;+2 for safety/equipment;+2 for physical assistance (bed level ) General ADL Comments: pt limited by genearlized weakness, L shoulder tone and decreased functional use, cognition     Vision Baseline Vision/History: Wears glasses Wears Glasses: At all times Patient Visual Report: No change from baseline Vision  Assessment?: Yes Eye Alignment: Impaired (comment) (dysconjugate gaze with L eye preference superrior and abduct) Ocular Range of Motion: Within Functional Limits Alignment/Gaze Preference: Gaze right Tracking/Visual Pursuits: Decreased smoothness of horizontal tracking Additional Comments: pt denies visual changes      Perception     Praxis      Pertinent Vitals/Pain Pain Assessment: No/denies pain     Hand Dominance Right (but was using L hand more due to weakness)   Extremity/Trunk Assessment Upper Extremity Assessment Upper Extremity Assessment: RUE deficits/detail;LUE deficits/detail RUE Deficits / Details: grossly 3-/5 MMT able to use functionally  RUE Sensation: WNL RUE Coordination: decreased fine motor;decreased gross motor LUE Deficits / Details: noted increased tone proximally (shoulder), gorssly 3-/5 MMT  LUE Sensation: WNL LUE Coordination: decreased fine motor;decreased gross motor   Lower Extremity Assessment Lower Extremity Assessment: Defer to PT evaluation       Communication Communication Communication: Expressive difficulties (soft spoken, slowed speech)   Cognition Arousal/Alertness: Awake/alert Behavior During Therapy: Flat affect Overall Cognitive Status: No family/caregiver present to determine baseline cognitive functioning                                 General Comments: patient presenting with flat affect and depressed mood, following commands with increased time to process and initate, fair awareness of deficits and need for assistance; continue assessment    General Comments  VSS on RA     Exercises     Shoulder Instructions      Home Living Family/patient expects to be discharged to:: Private residence Living Arrangements: Children Available Help at Discharge: Family;Personal care attendant;Available PRN/intermittently Type of Home: House Home Access: Level entry     Home Layout: One level     Bathroom  Shower/Tub: Other (comment) (uses rol in shower at [facility] 1 day/week )   Bathroom Toilet:  (uses [facility] )     Home Equipment: Bedside commode;Wheelchair - manual;Other (comment) (sara plus lift )   Additional Comments: Lives with brother and son. Attends [facility] MWF via transport Lucianne Lei, receives PT/OT on Wednesdays      Prior Functioning/Environment Level of Independence: Needs assistance  Gait / Transfers Assistance Needed: Requires assist for all mobility. Reports use of sara plus for transfers with assist from family or aide. 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 PCA assists 7x/day with all other aspects of ADLs; wears Depends; gets washup at bed-level or showers 1x/week at [facility] with assist    Comments: showers at [facility] 1x/week, PCA 7 days a week 2 hrs in AM and 2 hrs in PM         OT Problem List: Decreased range of motion;Decreased strength;Decreased activity tolerance;Impaired balance (sitting and/or standing);Decreased coordination;Impaired vision/perception;Decreased cognition;Decreased safety awareness;Decreased knowledge of use of DME or AE;Decreased knowledge of precautions;Obesity;Impaired tone;Impaired UE functional use      OT Treatment/Interventions: Self-care/ADL training;Therapeutic exercise;DME and/or AE instruction;Therapeutic activities;Cognitive remediation/compensation;Manual therapy;Neuromuscular education;Visual/perceptual remediation/compensation;Patient/family education;Balance training    OT Goals(Current goals can be found in the care plan section) Acute Rehab OT  Goals Patient Stated Goal: Return home with assist from family and rehab services at [facility] OT Goal Formulation: With patient Time For Goal Achievement: 02/20/20 Potential to Achieve Goals: Fair  OT Frequency: Min 2X/week   Barriers to D/C:            Co-evaluation               AM-PAC OT "6 Clicks" Daily Activity     Outcome Measure Help from another person eating meals?: A Little Help from another person taking care of personal grooming?: A Little Help from another person toileting, which includes using toliet, bedpan, or urinal?: Total Help from another person bathing (including washing, rinsing, drying)?: A Lot Help from another person to put on and taking off regular upper body clothing?: A Lot Help from another person to put on and taking off regular lower body clothing?: Total 6 Click Score: 12   End of Session Nurse Communication: Mobility status  Activity Tolerance: Patient tolerated treatment well Patient left: in bed;with call bell/phone within reach;with bed alarm set  OT Visit Diagnosis: Other abnormalities of gait and mobility (R26.89);Muscle weakness (generalized) (M62.81);Other symptoms and signs involving cognitive function                Time: 6004-5997 OT Time Calculation (min): 37 min Charges:  OT General Charges $OT Visit: 1 Visit OT Evaluation $OT Eval Moderate Complexity: 1 Mod OT Treatments $Self Care/Home Management : 8-22 mins  Jolaine Artist, OT Acute Rehabilitation Services Pager [phone] Office [phone]   Delight Stare 02/06/2020, 10:51 AM

## 2020-02-08 LAB — METHYLMALONIC ACID, SERUM: Methylmalonic Acid, Quantitative: 177 nmol/L (ref 0–378)

## 2020-03-05 ENCOUNTER — Encounter (HOSPITAL_COMMUNITY): Payer: Self-pay | Admitting: Emergency Medicine

## 2020-03-05 ENCOUNTER — Other Ambulatory Visit: Payer: Self-pay

## 2020-03-05 ENCOUNTER — Ambulatory Visit (HOSPITAL_COMMUNITY)
Admission: EM | Admit: 2020-03-05 | Discharge: 2020-03-05 | Disposition: A | Discharge status: Home / Self Care | Payer: Medicare (Managed Care)

## 2020-03-05 DIAGNOSIS — L89152 Pressure ulcer of sacral region, stage 2: Secondary | ICD-10-CM | POA: Diagnosis not present

## 2020-03-05 DIAGNOSIS — R21 Rash and other nonspecific skin eruption: Secondary | ICD-10-CM

## 2020-03-05 NOTE — Discharge Instructions (Addendum)
[name] [name] seems to have 2 different issues going on today.  The area on her buttocks is due to pressure sores.  I would talk to the [facility] provider about providing a barrier cream.  She needs to to be turned every 2 hours while awake to alleviate pressure on this area.  The rash is a separate issue.  Continue the prednisone as prescribed by Dr. Bradd Burner.

## 2020-03-05 NOTE — ED Triage Notes (Signed)
Pt presents with rash on buttocks and across the back of shoulder xs 2 wks.   Started prednisone today for rash.

## 2020-03-05 NOTE — ED Provider Notes (Signed)
[facility]    CSN: [id number] Arrival date & time: 03/05/20  1806     History   Chief Complaint Chief Complaint  Patient presents with  . Rash    HPI [name] [name] is a 69 y.o. female wheelchair-bound with hx of CVA and DM presents to UC with her brother with complaints of rash.  Brother who acts as 1 of patient's caregivers reports rash on buttocks and across her back and shoulders x2 weeks.  [facility]ends [facility] x [facility] and apparently evaluated while they are today and prescribed prednisone for rash.  Brother states she has had 1 dose prior to this visit.  Patient describes rash as itchy.  She denies any pain.  Brother denies any recent fever, chills, recent confusion, N/V/D.  No complaints of chest pain or SOB. No new medications.     Past Medical History:  Diagnosis Date  . Arthritis   . Diabetes mellitus without complication ([place])   . Stroke [facility])     Patient Active Problem List   Diagnosis Date Noted  . Acute weakness 02/04/2020  . Pressure injury of skin 11/19/2018  . Transient loss of consciousness 11/19/2018  . AKI (acute kidney injury) ([place]) 11/18/2018  . Uncontrolled diabetes mellitus ([place]) 10/18/2017  . Hypertension 10/18/2017  . History of stroke 10/18/2017    History reviewed. No pertinent surgical history.  OB History   No obstetric history on file.      Home Medications    Prior to Admission medications   Medication Sig Start Date End Date Taking? Authorizing Provider  acetaminophen (TYLENOL) 650 MG CR tablet Take 650 mg by mouth every 8 (eight) hours as needed for pain.    Yes [provider]  amLODipine (NORVASC) 10 MG tablet Take 10 mg by mouth daily. hypertension   Yes [provider]  aspirin EC 81 MG EC tablet Take 1 tablet (81 mg total) by mouth daily. Swallow whole. 02/07/20  Yes Sanjuan Dame, MD  atorvastatin (LIPITOR) 80 MG tablet Take 1 tablet (80 mg total) by mouth daily. 02/07/20 03/08/20 Yes  Sanjuan Dame, MD  buPROPion (WELLBUTRIN XL) 300 MG 24 hr tablet Take 300 mg by mouth daily. depression   Yes [provider]  clopidogrel (PLAVIX) 75 MG tablet Take 1 tablet (75 mg total) by mouth daily. 02/07/20 03/08/20 Yes Sanjuan Dame, MD  gabapentin (NEURONTIN) 400 MG capsule Take 400 mg by mouth at bedtime.    Yes [provider]  Magnesium 250 MG TABS Take 250 mg by mouth daily.   Yes [provider]  metFORMIN (GLUCOPHAGE-XR) 500 MG 24 hr tablet Take 500 mg by mouth daily with breakfast.   Yes [provider]  metoprolol succinate (TOPROL-XL) 50 MG 24 hr tablet Take 50 mg by mouth daily. Take with or immediately following a meal.   Yes [provider]  rOPINIRole (REQUIP) 0.25 MG tablet Take 0.25 mg by mouth at bedtime.   Yes [provider]  nitroGLYCERIN (NITROSTAT) 0.4 MG SL tablet Place 0.4 mg under the tongue every 5 (five) minutes as needed for chest pain.    [provider]  polyethylene glycol (MIRALAX / GLYCOLAX) packet Take 17 g by mouth daily as needed for mild constipation. Mix in 8 oz liquid and drink     [provider]  potassium chloride SA (K-DUR,KLOR-CON) 20 MEQ tablet Take 20 mEq by mouth daily.     [provider]    Family History History  reviewed. No pertinent family history.  Social History Social History   Tobacco Use  . Smoking status: Never Smoker  . Smokeless tobacco: Never Used  Substance Use Topics  . Alcohol use: Not Currently  . Drug use: Never     Allergies   Accupril [quinapril hcl], Ciprofloxacin, and Sal[place] [fish allergy]   Review of Systems As stated in HPI otherwise negative   Physical Exam Triage Vital Signs ED Triage Vitals  Enc Vitals Group     BP 03/05/20 2013 103/67     Pulse Rate 03/05/20 2013 86     Resp 03/05/20 2013 16     Temp 03/05/20 2013 98.6 F (37 C)     Temp Source 03/05/20 2013 Oral     SpO2 03/05/20 2013 97 %     Weight  --      Height --      Head Circumference --      Peak Flow --      Pain Score 03/05/20 2011 10     Pain Loc --      Pain Edu? --      Excl. in [place]? --    No data found.  Updated Vital Signs BP 103/67 (BP Location: Right Arm)   Pulse 86   Temp 98.6 F (37 C) (Oral)   Resp 16   SpO2 97%   Visual Acuity Right Eye Distance:   Left Eye Distance:   Bilateral Distance:    Right Eye Near:   Left Eye Near:    Bilateral Near:     Physical Exam Constitutional:      Comments: Chronically ill-appearing but in no acute distress.  Awake and alert  Skin:    Comments: Generalized, red macular lesions over patient's back and chest. Scratch marks on flanks presumably from itching.       UC Treatments / Results  Labs (all labs ordered are listed, but only abnormal results are displayed) Labs Reviewed - No data to display  EKG   Radiology No results found.  Procedures Procedures (including critical care time)  Medications Ordered in UC Medications - No data to display  Initial Impression / Assessment and Plan / UC Course  I have reviewed the triage vital signs and the nursing notes.  Pertinent labs & imaging results that were available during my care of the patient were reviewed by me and considered in my medical decision making (see chart for details).  Rash -appears allergic in nature though unclear etiology. No new meds per brother -Continue prednisone as rx'd today by PCP  Pressure wounds, sacral -stage II and unrelated to above issue. Due to chronic immobilization -Discussed c brother that there is little to do from an UC perspective. No evidence of infection. Long discussion regarding frequent turning of pt to relieve pressure, keep area clean and dry -Brother to discuss barrier cream and possible wound care consult  Final Clinical Impressions(s) / UC Diagnoses   Final diagnoses:  Rash  Pressure injury of sacral region, stage 2 [facility])     Discharge  Instructions     [name] seems to have 2 different issues going on today.  The area on her buttocks is due to pressure sores.  I would talk to the [facility] provider about providing a barrier cream.  She needs to to be turned every 2 hours while awake to alleviate pressure on this area.  The rash is a separate issue.  Continue the prednisone as prescribed by Dr.  Bradd Burner.    ED Prescriptions    None     PDMP not reviewed this encounter.   Rudolpho Sevin, NP 03/05/20 2207

## 2020-03-07 ENCOUNTER — Ambulatory Visit: Payer: Medicare (Managed Care)

## 2020-05-08 ENCOUNTER — Other Ambulatory Visit: Payer: Self-pay

## 2020-05-08 ENCOUNTER — Ambulatory Visit
Admission: RE | Admit: 2020-05-08 | Discharge: 2020-05-08 | Disposition: A | Discharge status: Home / Self Care | Payer: Medicare (Managed Care) | Source: Ambulatory Visit | Attending: Internal Medicine | Admitting: Internal Medicine

## 2020-05-08 DIAGNOSIS — Z1231 Encounter for screening mammogram for malignant neoplasm of breast: Secondary | ICD-10-CM

## 2020-05-23 ENCOUNTER — Other Ambulatory Visit: Payer: Self-pay | Admitting: Internal Medicine

## 2020-05-23 DIAGNOSIS — Z1231 Encounter for screening mammogram for malignant neoplasm of breast: Secondary | ICD-10-CM

## 2020-08-03 ENCOUNTER — Ambulatory Visit
Admission: RE | Admit: 2020-08-03 | Discharge: 2020-08-03 | Disposition: A | Discharge status: Home / Self Care | Payer: Medicare (Managed Care) | Source: Ambulatory Visit | Attending: Nurse Practitioner | Admitting: Nurse Practitioner

## 2020-08-03 ENCOUNTER — Other Ambulatory Visit: Payer: Self-pay | Admitting: Nurse Practitioner

## 2020-08-03 DIAGNOSIS — W19XXXA Unspecified fall, initial encounter: Secondary | ICD-10-CM

## 2021-05-10 ENCOUNTER — Ambulatory Visit

## 2021-08-31 IMAGING — DX DG ELBOW COMPLETE 3+V*L*
4 series · 4 of 4 positions shown · non-contrast
Comparison: None

CLINICAL DATA: Fell and injured left elbow. Limited range of
motion.

EXAM:
LEFT ELBOW - COMPLETE 3+ VIEW

[dg elbow complete left (3+view) (1 of 4)]
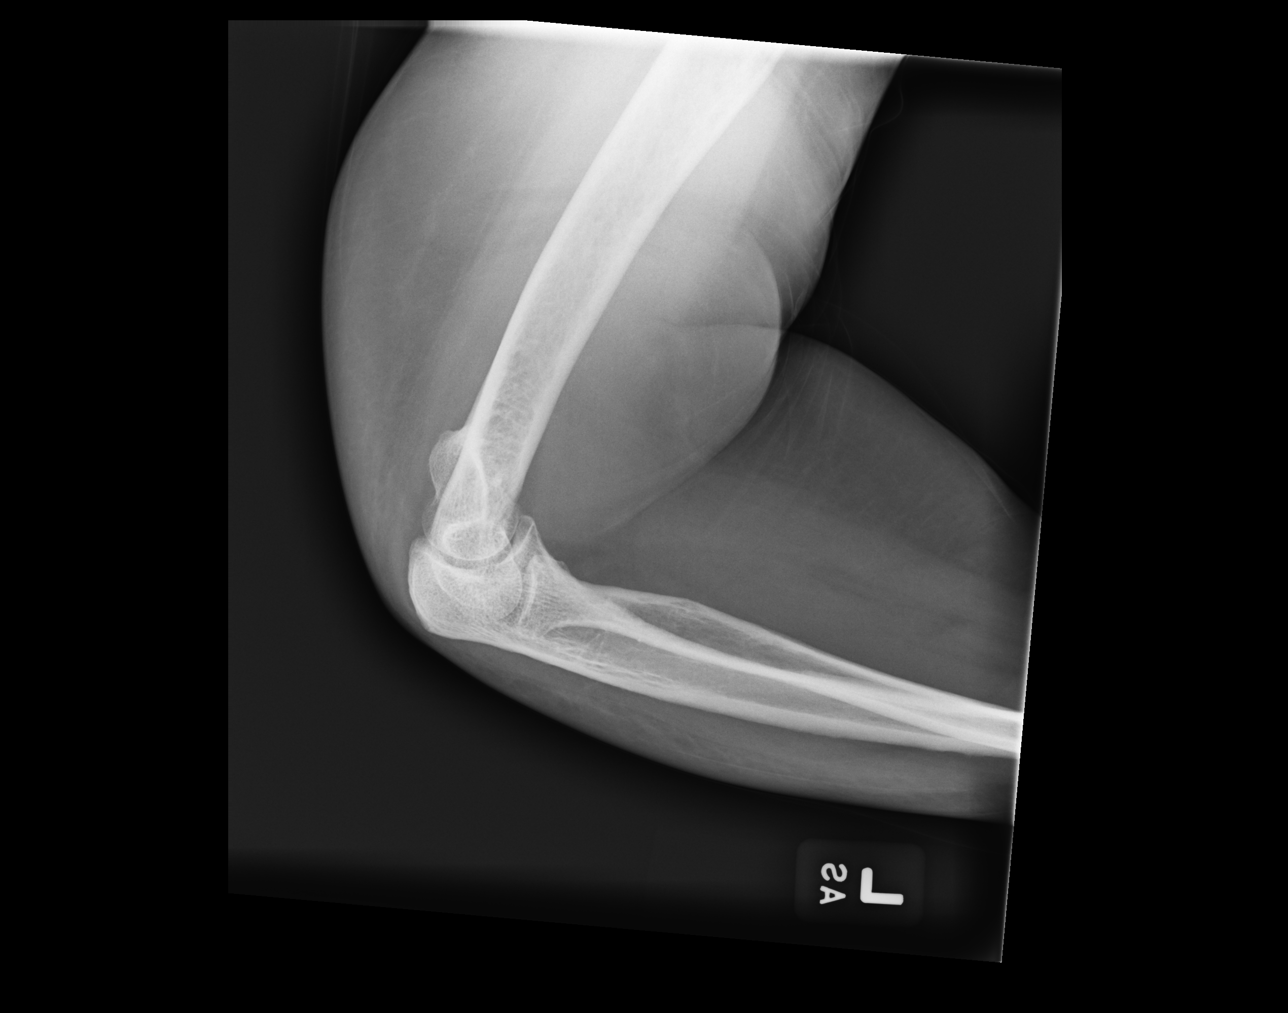

[dg elbow complete left (3+view) (2 of 4)]
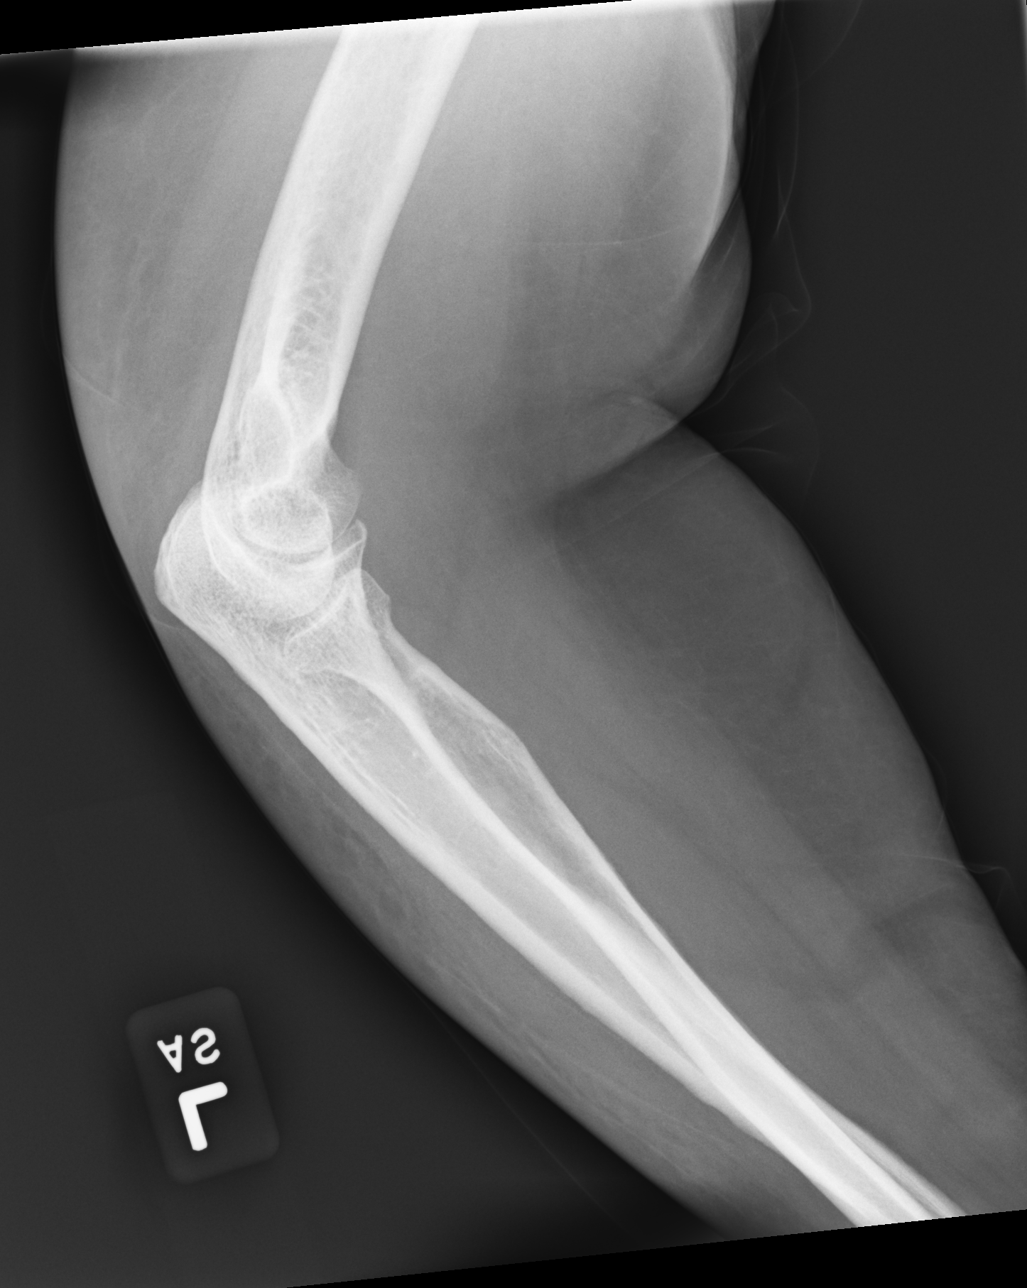

[dg elbow complete left (3+view) (3 of 4)]
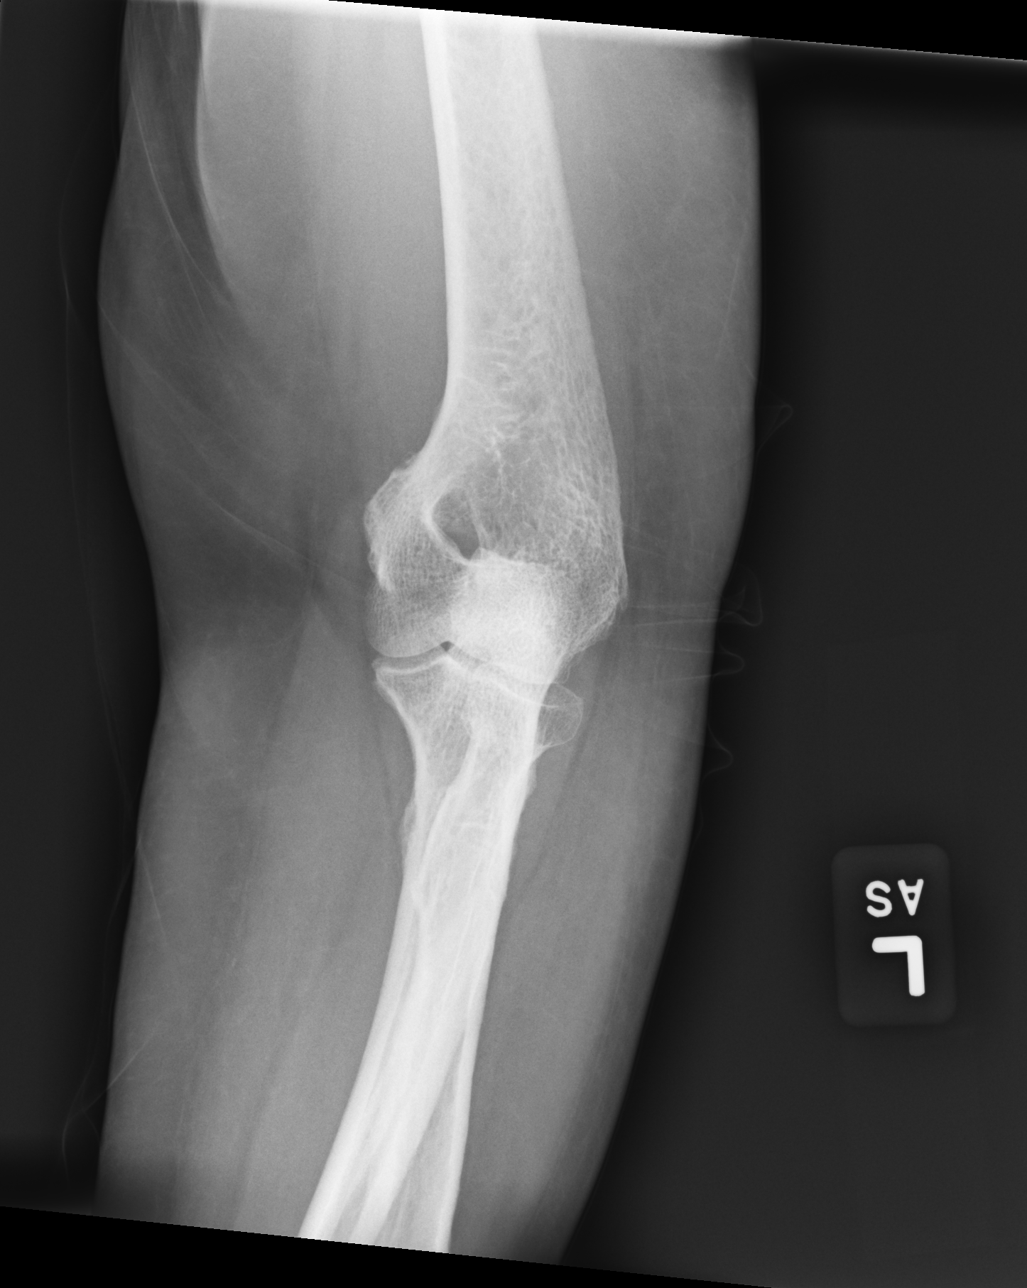

[dg elbow complete left (3+view) (4 of 4)]
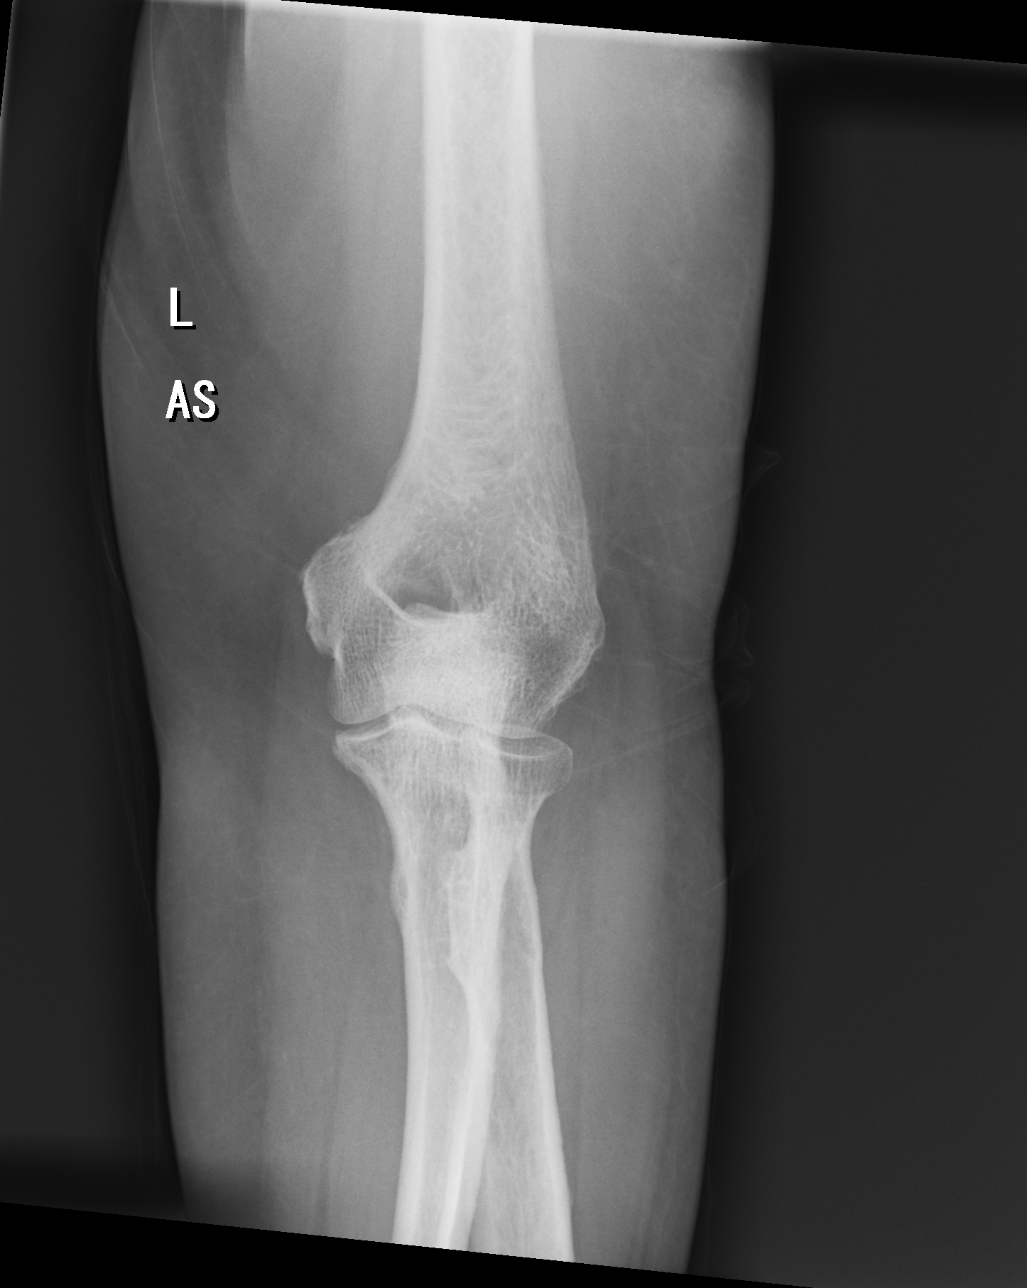

[4 of 4 positions shown; findings below may reference images not displayed]

FINDINGS: The joint spaces are maintained. Minimal degenerative changes. No
acute fracture or osteochondral lesion. No elbow joint effusion.
IMPRESSION: Minimal degenerative changes but no acute bony findings.

## 2022-06-23 DEATH — deceased
